# Patient Record
Sex: Male | Born: 1995 | Race: Black or African American | Hispanic: No | Marital: Single | Smoking: Never smoker
Health system: Southern US, Community
[De-identification: ages and names within clinical notes are randomized; demographics above are authoritative.]

## PROBLEM LIST (undated history)

## (undated) DIAGNOSIS — T4145XA Adverse effect of unspecified anesthetic, initial encounter: Secondary | ICD-10-CM

## (undated) DIAGNOSIS — R519 Headache, unspecified: Secondary | ICD-10-CM

## (undated) DIAGNOSIS — S86019A Strain of unspecified Achilles tendon, initial encounter: Secondary | ICD-10-CM

## (undated) DIAGNOSIS — T8859XA Other complications of anesthesia, initial encounter: Secondary | ICD-10-CM

## (undated) DIAGNOSIS — R51 Headache: Secondary | ICD-10-CM

## (undated) DIAGNOSIS — J189 Pneumonia, unspecified organism: Secondary | ICD-10-CM

## (undated) DIAGNOSIS — T7840XA Allergy, unspecified, initial encounter: Secondary | ICD-10-CM

## (undated) HISTORY — PX: TONSILLECTOMY: SUR1361

## (undated) HISTORY — PX: APPENDECTOMY: SHX54

## (undated) HISTORY — DX: Allergy, unspecified, initial encounter: T78.40XA

## (undated) HISTORY — PX: HERNIA REPAIR: SHX51

---
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
## 2014-01-24 ENCOUNTER — Ambulatory Visit (INDEPENDENT_AMBULATORY_CARE_PROVIDER_SITE_OTHER): Payer: PRIVATE HEALTH INSURANCE | Admitting: Physician Assistant

## 2014-01-24 ENCOUNTER — Encounter: Payer: Self-pay | Admitting: Physician Assistant

## 2014-01-24 VITALS — BP 142/73 | HR 69 | Temp 98.4°F | Resp 16 | Ht 72.0 in | Wt 247.0 lb

## 2014-01-24 DIAGNOSIS — Z13220 Encounter for screening for lipoid disorders: Secondary | ICD-10-CM

## 2014-01-24 DIAGNOSIS — Z0000 Encounter for general adult medical examination without abnormal findings: Secondary | ICD-10-CM

## 2014-01-24 DIAGNOSIS — R51 Headache: Secondary | ICD-10-CM

## 2014-01-24 DIAGNOSIS — Z113 Encounter for screening for infections with a predominantly sexual mode of transmission: Secondary | ICD-10-CM

## 2014-01-24 DIAGNOSIS — R519 Headache, unspecified: Secondary | ICD-10-CM

## 2014-01-24 NOTE — Progress Notes (Signed)
   Subjective:    Patient ID: Larry Haas, male    DOB: 16-Mar-1995, 19 y.o.   MRN: [id number]  HPI    Review of Systems  Constitutional: Positive for diaphoresis.  Eyes: Negative.   Respiratory: Positive for shortness of breath.   Cardiovascular: Negative.   Gastrointestinal: Negative.   Endocrine: Positive for heat intolerance and polyphagia.  Genitourinary: Negative.   Musculoskeletal: Negative.   Skin: Negative.   Allergic/Immunologic: Negative.   Neurological: Positive for headaches.  Hematological: Negative.   Psychiatric/Behavioral: Negative.        Objective:   Physical Exam        Assessment & Plan:

## 2014-01-24 NOTE — Patient Instructions (Signed)
Your physical exam was normal today. I will be in contact with you regarding the results of your labs. I put in an order for you to get your cholesterol drawn anytime in the next year. Just be sure you are fasting and come to the clinic when we are open. I will let you know those results when they're available.  A good first place to start as far as diet is limiting all sugary drinks. Another good idea is limiting the alcohol. Try to do one or both of these over the next few months. We can either see you back in 3 months to discuss further, see you back in 1 year for another physical, or if you're still struggling despite these measures I can refer you to a dietitiian. Please just let me know.

## 2014-01-24 NOTE — Progress Notes (Signed)
Subjective:    Patient ID: Larry Haas, male    DOB: 07-16-1995, 19 y.o.   MRN: [id number]  PCP: No PCP Per Patient  Chief Complaint  Patient presents with  . Annual Exam   There are no active problems to display for this patient.  Prior to Admission medications   Medication Sig Start Date End Date Taking? Authorizing Provider  Omega-3 Fatty Acids (FISH OIL PO) Take 1 capsule by mouth at bedtime.   Yes Historical Provider, MD   Medications, allergies, past medical history, surgical history, family history, social history and problem list reviewed and updated.  HPI  19 yom with no significant pmh presents for annual physical exam today.  No issues ongoing, he states his parents requested he come have a physical.  Student at [employer]. He exercises most days both weights and cardio. Eats well balanced meals whether at home or school. He is interested in losing a few pounds and is interested in some food tips. He drinks occasional sugary drinks. Drinks alcohol on weekends socially, also eats a lot of fast food/pizza when he is drinking.   Avg meals: Breakfast: Eggs, bacon, grits Lunch: Sandwich/fruit Dinner: Veggies/meat/pasta Snacks: Fruit/granola  He has been having HAs on Tues and Thurs afternoons for the past few months. He has a late night class on Mon and Wed then has stressful days on T/R. He has assoc photo/phonophobia. No aura. The HAs are frontal and relieve with water and aleve. No vision changes. He has never been diagnosed with migraines.   Received flu vaccine 11/2103. TDap within past 5 years per pt.   Interested in std screening. No sx but multiple partners. Uses protection most times.   States his mom wants him to have cholesterol checked.  Review of Systems No CP, fever, chills. Positive sob with heavy exertion. Positive sweating with exertion.     Objective:   Physical Exam  Constitutional: He is oriented to person, place, and time. He appears  well-developed and well-nourished.  Non-toxic appearance. He does not have a sickly appearance. He does not appear ill. No distress.  BP 142/73 mmHg  Pulse 69  Temp(Src) 98.4 F (36.9 C) (Oral)  Resp 16  Ht 6' (1.829 m)  Wt 247 lb (112.038 kg)  BMI 33.49 kg/m2  SpO2 99%   HENT:  Right Ear: Tympanic membrane normal.  Left Ear: Tympanic membrane normal.  Mouth/Throat: Uvula is midline, oropharynx is clear and moist and mucous membranes are normal. No oropharyngeal exudate, posterior oropharyngeal edema, posterior oropharyngeal erythema or tonsillar abscesses.  Eyes: Conjunctivae and EOM are normal. Pupils are equal, round, and reactive to light.  Neck: Normal range of motion. No thyromegaly present.  Cardiovascular: Normal rate, regular rhythm and normal heart sounds.  Exam reveals no gallop.   No murmur heard. Pulses:      Dorsalis pedis pulses are 2+ on the right side, and 2+ on the left side.  Pulmonary/Chest: Effort normal and breath sounds normal. He has no decreased breath sounds. He has no wheezes. He has no rhonchi. He has no rales.  Abdominal: Soft. Normal appearance and bowel sounds are normal. There is no tenderness. Hernia confirmed negative in the right inguinal area and confirmed negative in the left inguinal area.  Genitourinary: Testes normal and penis normal. Right testis shows no mass and no tenderness. Left testis shows no mass and no tenderness. Circumcised.  Neurological: He is alert and oriented to person, place, and time. He has normal strength.  No cranial nerve deficit or sensory deficit.  Reflex Scores:      Patellar reflexes are 2+ on the right side and 2+ on the left side. Psychiatric: He has a normal mood and affect. His speech is normal.      Assessment & Plan:   19 yom with no significant pmh presents for annual physical exam today.  Annual physical exam --exam benign --bp mildly elevated, encouraged aerobic exercise, limiting salt, repeat at next  visit --rtc one year --rec limiting sugary drinks, alcohol, and late night snacks, if weight elevation persists can refer to dietitian, pt will let me know if interested --rec abstaining from alcohol   Nonintractable episodic headache, unspecified headache type --most likely due to stress, dehydration, lack of sleep with its predictable pattern --rec sleep, fluids, continue aleve/can try excedrin migraine --rtc if worsening  Screen for STD (sexually transmitted disease) - Plan: HIV antibody, RPR, GC/Chlamydia Probe Amp --encouraged protection and limiting number partners --hiv,rpr, gc/ct testing today --has received gardasil vaccine  Screening for hyperlipidemia - Plan: Lipid panel --pt not fasting, put in future order for lipids  Donnajean Lopes, PA-C Physician Assistant-Certified [facility]  01/24/2014 1:07 PM

## 2014-01-25 LAB — HIV ANTIBODY (ROUTINE TESTING W REFLEX): HIV: NONREACTIVE

## 2014-01-25 LAB — RPR

## 2014-01-26 LAB — GC/CHLAMYDIA PROBE AMP
CT Probe RNA: NEGATIVE
GC PROBE AMP APTIMA: NEGATIVE

## 2014-10-18 ENCOUNTER — Encounter (HOSPITAL_COMMUNITY): Payer: Self-pay | Admitting: Emergency Medicine

## 2014-10-18 ENCOUNTER — Emergency Department (HOSPITAL_COMMUNITY)
Admission: EM | Admit: 2014-10-18 | Discharge: 2014-10-18 | Disposition: A | Discharge status: Home / Self Care | Payer: 59 | Source: Home / Self Care | Attending: Emergency Medicine | Admitting: Emergency Medicine

## 2014-10-18 DIAGNOSIS — B349 Viral infection, unspecified: Secondary | ICD-10-CM

## 2014-10-18 DIAGNOSIS — K226 Gastro-esophageal laceration-hemorrhage syndrome: Secondary | ICD-10-CM

## 2014-10-18 LAB — POCT I-STAT, CHEM 8
BUN: 25 mg/dL — ABNORMAL HIGH (ref 6–20)
CALCIUM ION: 1.22 mmol/L (ref 1.12–1.23)
Chloride: 104 mmol/L (ref 101–111)
Creatinine, Ser: 1.1 mg/dL (ref 0.61–1.24)
Glucose, Bld: 91 mg/dL (ref 65–99)
HCT: 55 % — ABNORMAL HIGH (ref 39.0–52.0)
Hemoglobin: 18.7 g/dL — ABNORMAL HIGH (ref 13.0–17.0)
Potassium: 3.5 mmol/L (ref 3.5–5.1)
SODIUM: 142 mmol/L (ref 135–145)
TCO2: 26 mmol/L (ref 0–100)

## 2014-10-18 MED ORDER — ONDANSETRON 4 MG PO TBDP: 4.0000 mg | ORAL_TABLET | Freq: Three times a day (TID) | ORAL | Status: DC | PRN

## 2014-10-18 MED ORDER — ONDANSETRON 4 MG PO TBDP
4.0000 mg | ORAL_TABLET | Freq: Once | ORAL | Status: AC
  Administered 2014-10-18: 4 mg via ORAL

## 2014-10-18 MED ORDER — ONDANSETRON 4 MG PO TBDP
ORAL_TABLET | ORAL | Status: AC
  Filled 2014-10-18: qty 1

## 2014-10-18 NOTE — ED Provider Notes (Signed)
CSN: [id number]     Arrival date & time 10/18/14  1310 History   First MD Initiated Contact with Patient 10/18/14 1341     Chief Complaint  Patient presents with  . Hematemesis  . Cough  . Dizziness  . Headache  . Sore Throat   (Consider location/radiation/quality/duration/timing/severity/associated sxs/prior Treatment) HPI  He is a 19 year old man here with his mother for evaluation of vomiting blood. He states he has had mild congestion and a cough for the last week. This was associated with some intermittent shortness of breath. Then, this morning, he started vomiting. He had 6 fairly violent episodes of vomiting, the last 3 he states were straight blood. He reports some continued mild nausea. Last vomiting was 11:30 this morning. He denies any chest pain or abdominal pain. No diarrhea. He also reports getting a headache this morning. He states he does have a history of migraines, but this is different. He states it moved around his head from one side to the other. He denies any pain at this moment. He also reports feeling dizzy and sweaty when he was standing in line at the check-in.  No fevers.  Past Medical History  Diagnosis Date  . Allergy    Past Surgical History  Procedure Laterality Date  . Tonsillectomy    . Appendectomy    . Hernia repair      At 9 months old    Family History  Problem Relation Age of Onset  . Hyperlipidemia Mother   . Hypertension Mother   . Diabetes Maternal Grandmother   . Hyperlipidemia Maternal Grandmother   . Hypertension Maternal Grandmother   . Cancer Maternal Grandfather   . Heart disease Maternal Grandfather   . Hyperlipidemia Maternal Grandfather   . Hypertension Maternal Grandfather   . Diabetes Paternal Grandmother   . Hyperlipidemia Paternal Grandfather   . Hypertension Paternal Grandfather   . Mental illness Paternal Grandfather    Social History  Substance Use Topics  . Smoking status: Never Smoker   . Smokeless tobacco: None   . Alcohol Use: No    Review of Systems As in history of present illness Allergies  Review of patient's allergies indicates no known allergies.  Home Medications   Prior to Admission medications   Medication Sig Start Date End Date Taking? Authorizing Provider  ibuprofen (ADVIL) 200 MG tablet Take 400 mg by mouth every 6 (six) hours as needed.   Yes Historical Provider, MD  Omega-3 Fatty Acids (FISH OIL PO) Take 1 capsule by mouth at bedtime.    Historical Provider, MD  ondansetron (ZOFRAN-ODT) 4 MG disintegrating tablet Take 1 tablet (4 mg total) by mouth every 8 (eight) hours as needed for nausea or vomiting. 10/18/14   Charm Rings, MD   Meds Ordered and Administered this Visit   Medications  ondansetron (ZOFRAN-ODT) disintegrating tablet 4 mg (4 mg Oral Given 10/18/14 1420)    BP 138/68 mmHg  Pulse 64  Temp(Src) 98.1 F (36.7 C) (Oral)  Resp 18  SpO2 100% Orthostatic VS for the past 24 hrs:  BP- Lying Pulse- Lying BP- Sitting Pulse- Sitting BP- Standing at 0 minutes Pulse- Standing at 0 minutes  10/18/14 1438 121/61 mmHg 58 117/61 mmHg 81 125/50 mmHg 83    Physical Exam  Constitutional: He is oriented to person, place, and time. He appears well-developed and well-nourished. No distress.  HENT:  Mouth/Throat: Oropharynx is clear and moist. No oropharyngeal exudate.  Eyes: Conjunctivae are normal.  Neck: Neck  supple.  Cardiovascular: Normal rate, regular rhythm and normal heart sounds.   No murmur heard. Pulmonary/Chest: Effort normal and breath sounds normal. No respiratory distress. He has no wheezes. He has no rales.  Abdominal: Soft. Bowel sounds are normal. He exhibits no distension. There is tenderness (denies pain, but states it felt off with palpation in the epigastric area.). There is no rebound and no guarding.  Lymphadenopathy:    He has no cervical adenopathy.  Neurological: He is alert and oriented to person, place, and time.    ED Course  Procedures  (including critical care time)  Labs Review Labs Reviewed  POCT I-STAT, CHEM 8 - Abnormal; Notable for the following:    BUN 25 (*)    Hemoglobin 18.7 (*)    HCT 55.0 (*)    All other components within normal limits    Imaging Review No results found.   MDM   1. Mallory-Weiss tear   2. Viral illness    Orthostatics and i-STAT are within normal limits. He reports feeling better after the Zofran. He likely has a viral illness that caused the vomiting and has a small Mallory-Weiss tear. Recommended symptomatic treatment with Zofran as needed. Return precautions reviewed.    Charm Rings, MD 10/18/14 1447

## 2014-10-18 NOTE — ED Notes (Signed)
Pt started with a cough one week ago, developed a sore throat yesterday, then woke up this morning with a headache at 0400.  About mid morning he vomited 6 times and states the last three times he did he threw up only blood.  Pt has a picture of it on his phone.  He denies any fever.

## 2014-10-18 NOTE — Discharge Instructions (Signed)
You likely have a virus that is causing your symptoms. The blood in your vomit is from a small tear in the esophagus. Your vital signs and hemoglobin level are excellent. If we can control your nausea and vomiting, this tear will heal over the next few days. Take Zofran every 8 hours as needed for nausea or vomiting. No weight lifting until Saturday. If you continue to have blood in your vomit, you are getting dizzy, see blood in your stool, please go to the emergency room.

## 2014-11-16 ENCOUNTER — Ambulatory Visit (INDEPENDENT_AMBULATORY_CARE_PROVIDER_SITE_OTHER): Payer: 59 | Admitting: Physician Assistant

## 2014-11-16 VITALS — BP 120/74 | HR 63 | Temp 97.9°F | Resp 16 | Ht 71.25 in | Wt 247.4 lb

## 2014-11-16 DIAGNOSIS — J069 Acute upper respiratory infection, unspecified: Secondary | ICD-10-CM | POA: Diagnosis not present

## 2014-11-16 MED ORDER — HYDROCOD POLST-CPM POLST ER 10-8 MG/5ML PO SUER: 5.0000 mL | Freq: Every evening | ORAL | Status: DC | PRN

## 2014-11-16 MED ORDER — IPRATROPIUM BROMIDE 0.03 % NA SOLN: 2.0000 | Freq: Two times a day (BID) | NASAL | Status: DC

## 2014-11-16 MED ORDER — BENZONATATE 100 MG PO CAPS: 100.0000 mg | ORAL_CAPSULE | Freq: Three times a day (TID) | ORAL | Status: DC | PRN

## 2014-11-16 NOTE — Patient Instructions (Signed)
Upper Respiratory Infection, Adult Most upper respiratory infections (URIs) are a viral infection of the air passages leading to the lungs. A URI affects the nose, throat, and upper air passages. The most common type of URI is nasopharyngitis and is typically referred to as "the common cold." URIs run their course and usually go away on their own. Most of the time, a URI does not require medical attention, but sometimes a bacterial infection in the upper airways can follow a viral infection. This is called a secondary infection. Sinus and middle ear infections are common types of secondary upper respiratory infections. Bacterial pneumonia can also complicate a URI. A URI can worsen asthma and chronic obstructive pulmonary disease (COPD). Sometimes, these complications can require emergency medical care and may be life threatening.  CAUSES Almost all URIs are caused by viruses. A virus is a type of germ and can spread from one person to another.  RISKS FACTORS You may be at risk for a URI if:   You smoke.   You have chronic heart or lung disease.  You have a weakened defense (immune) system.   You are very young or very old.   You have nasal allergies or asthma.  You work in crowded or poorly ventilated areas.  You work in health care facilities or schools. SIGNS AND SYMPTOMS  Symptoms typically develop 2-3 days after you come in contact with a cold virus. Most viral URIs last 7-10 days. However, viral URIs from the influenza virus (flu virus) can last 14-18 days and are typically more severe. Symptoms may include:   Runny or stuffy (congested) nose.   Sneezing.   Cough.   Sore throat.   Headache.   Fatigue.   Fever.   Loss of appetite.   Pain in your forehead, behind your eyes, and over your cheekbones (sinus pain).  Muscle aches.  DIAGNOSIS  Your health care provider may diagnose a URI by:  Physical exam.  Tests to check that your symptoms are not due to  another condition such as:  Strep throat.  Sinusitis.  Pneumonia.  Asthma. TREATMENT  A URI goes away on its own with time. It cannot be cured with medicines, but medicines may be prescribed or recommended to relieve symptoms. Medicines may help:  Reduce your fever.  Reduce your cough.  Relieve nasal congestion. HOME CARE INSTRUCTIONS   Take medicines only as directed by your health care provider.   Gargle warm saltwater or take cough drops to comfort your throat as directed by your health care provider.  Use a warm mist humidifier or inhale steam from a shower to increase air moisture. This may make it easier to breathe.  Drink enough fluid to keep your urine clear or pale yellow.   Eat soups and other clear broths and maintain good nutrition.   Rest as needed.   Return to work when your temperature has returned to normal or as your health care provider advises. You may need to stay home longer to avoid infecting others. You can also use a face mask and careful hand washing to prevent spread of the virus.  Increase the usage of your inhaler if you have asthma.   Do not use any tobacco products, including cigarettes, chewing tobacco, or electronic cigarettes. If you need help quitting, ask your health care provider. PREVENTION  The best way to protect yourself from getting a cold is to practice good hygiene.   Avoid oral or hand contact with people with cold   symptoms.   Wash your hands often if contact occurs.  There is no clear evidence that vitamin C, vitamin E, echinacea, or exercise reduces the chance of developing a cold. However, it is always recommended to get plenty of rest, exercise, and practice good nutrition.  SEEK MEDICAL CARE IF:   You are getting worse rather than better.   Your symptoms are not controlled by medicine.   You have chills.  You have worsening shortness of breath.  You have brown or red mucus.  You have yellow or brown nasal  discharge.  You have pain in your face, especially when you bend forward.  You have a fever.  You have swollen neck glands.  You have pain while swallowing.  You have white areas in the back of your throat. SEEK IMMEDIATE MEDICAL CARE IF:   You have severe or persistent:  Headache.  Ear pain.  Sinus pain.  Chest pain.  You have chronic lung disease and any of the following:  Wheezing.  Prolonged cough.  Coughing up blood.  A change in your usual mucus.  You have a stiff neck.  You have changes in your:  Vision.  Hearing.  Thinking.  Mood. MAKE SURE YOU:   Understand these instructions.  Will watch your condition.  Will get help right away if you are not doing well or get worse.   This information is not intended to replace advice given to you by your health care provider. Make sure you discuss any questions you have with your health care provider.   Document Released: 07/03/2000 Document Revised: 05/24/2014 Document Reviewed: 04/14/2013 Elsevier Interactive Patient Education 2016 Elsevier Inc.  

## 2014-11-16 NOTE — Progress Notes (Signed)
   Subjective:    Patient ID: Larry Haas, male    DOB: 09/14/1995, 19 y.o.   MRN: [id number]  HPI Patient presents for cough that has been present for the past 6 days and is now so bad the coughing causes headaches, SOB, and has had an episode of vomiting and bright blood came out in vomit. Additionally endorses congestion, rhinorrhea, and a day of chills. Denies fever, sinus pressure, wheezing, palpitations, edema, or CP. Was sick with similar symptoms 3 weeks ago and felt better, but then a few days later cough started flaring up again. Has multiple sick contacts living in dorms and adds has more people around him that are sick than are well. Has seasonal allergies that are currently controlled, but no h/o asthma. NKDA.   Review of Systems As noted above.    Objective:   Physical Exam  Constitutional: He is oriented to person, place, and time. He appears well-developed and well-nourished. No distress.  Blood pressure 120/74, pulse 63, temperature 97.9 F (36.6 C), temperature source Oral, resp. rate 16, height 5' 11.25" (1.81 m), weight 247 lb 6.4 oz (112.22 kg), SpO2 98 %.  HENT:  Head: Normocephalic and atraumatic.  Right Ear: Tympanic membrane, external ear and ear canal normal.  Left Ear: Tympanic membrane and ear canal normal.  Nose: Rhinorrhea present. No mucosal edema. Right sinus exhibits no maxillary sinus tenderness and no frontal sinus tenderness. Left sinus exhibits no maxillary sinus tenderness and no frontal sinus tenderness.  Mouth/Throat: Uvula is midline, oropharynx is clear and moist and mucous membranes are normal. No oropharyngeal exudate, posterior oropharyngeal edema or posterior oropharyngeal erythema.  Bilateral hypertrophic tonsils.   Eyes: Conjunctivae are normal. Pupils are equal, round, and reactive to light. Right eye exhibits no discharge. Left eye exhibits no discharge. No scleral icterus.  Neck: Normal range of motion. Neck supple. No thyromegaly  present.  Cardiovascular: Normal rate, regular rhythm and normal heart sounds.  Exam reveals no gallop and no friction rub.   No murmur heard. Pulmonary/Chest: Effort normal and breath sounds normal. No respiratory distress. He has no wheezes. He has no rales. He exhibits no tenderness.  Abdominal: Soft. Bowel sounds are normal. He exhibits no distension and no mass. There is no tenderness. There is no rebound and no guarding.  Lymphadenopathy:    He has no cervical adenopathy.  Neurological: He is alert and oriented to person, place, and time.  Skin: Skin is warm and dry. No rash noted. He is not diaphoretic. No erythema. No pallor.       Assessment & Plan:  1. Acute upper respiratory infection Plenty of fluid and rest. - benzonatate (TESSALON) 100 MG capsule; Take 1-2 capsules (100-200 mg total) by mouth 3 (three) times daily as needed for cough.  Dispense: 40 capsule; Refill: 0 - ipratropium (ATROVENT) 0.03 % nasal spray; Place 2 sprays into both nostrils 2 (two) times daily.  Dispense: 30 mL; Refill: 0 - chlorpheniramine-HYDROcodone (TUSSIONEX PENNKINETIC ER) 10-8 MG/5ML SUER; Take 5 mLs by mouth at bedtime as needed for cough.  Dispense: 75 mL; Refill: 0   Jarmaine Ehrler PA-C  [facility] 11/16/2014 4:57 PM

## 2014-12-12 NOTE — Progress Notes (Signed)
  Medical screening examination/treatment/procedure(s) were performed by non-physician practitioner and as supervising physician I was immediately available for consultation/collaboration.     

## 2015-01-03 ENCOUNTER — Encounter: Payer: Self-pay | Admitting: Physician Assistant

## 2015-01-03 ENCOUNTER — Ambulatory Visit (INDEPENDENT_AMBULATORY_CARE_PROVIDER_SITE_OTHER): Payer: 59

## 2015-01-03 ENCOUNTER — Ambulatory Visit (INDEPENDENT_AMBULATORY_CARE_PROVIDER_SITE_OTHER): Payer: 59 | Admitting: Physician Assistant

## 2015-01-03 VITALS — BP 136/83 | HR 70 | Temp 97.9°F | Resp 16 | Ht 72.5 in | Wt 253.0 lb

## 2015-01-03 DIAGNOSIS — R059 Cough, unspecified: Secondary | ICD-10-CM

## 2015-01-03 DIAGNOSIS — L659 Nonscarring hair loss, unspecified: Secondary | ICD-10-CM | POA: Diagnosis not present

## 2015-01-03 DIAGNOSIS — Z111 Encounter for screening for respiratory tuberculosis: Secondary | ICD-10-CM | POA: Diagnosis not present

## 2015-01-03 DIAGNOSIS — Z23 Encounter for immunization: Secondary | ICD-10-CM | POA: Diagnosis not present

## 2015-01-03 DIAGNOSIS — R05 Cough: Secondary | ICD-10-CM

## 2015-01-03 NOTE — Progress Notes (Signed)
[facility] [address], [place] [id number] [phone]  Date:  01/03/2015   Name:  Larry HenriKullen Samuel Basham   DOB:  01/29/1995   MRN:  [id number]  PCP:  No PCP Per Patient    Chief Complaint: Body Fluid Exposure and Cough   History of Present Illness:  This is a 19 y.o. male who is presenting stating he was possibly exposed to someone with TB. He states he has been living with his current roommate x 3 months. He learned yesterday morning that his roommate was treated for latent TB 2 years ago. He is worried that he has contracted it. He states his roommate coughs all the time. In addition, he reports he has been coughing for the past 2 months. He was seen here 10/26 and dx'd with viral uri. Treated supportively and symptoms improved. He now has cough that is a mix of dry and productive. Happens mostly in the morning when he wakes up. He does have a hx seasonal allergies but only in spring time. No hx asthma and not a smoker. He has had some intermittent nasal congestion over past 2 months as well. No itchy eyes or itchy throat. Denies fever, chills, night sweats or unintended weight loss. He did have an episode of hemoptysis 9/27 during another uri and was dx'd with mallory weiss tears. No hemoptysis since. Pt is a Consulting civil engineerstudent. 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 Has never been tested for TB. Born [place] the [place]  Pt also wondering about a bald spot on the back of his head that has been present x 5 months. Asymptomatic. Mother has alopecia.  Review of Systems:  Review of Systems See HPI  There are no active problems to display for this patient.   Prior to Admission medications   Medication Sig Start Date End Date Taking? Authorizing Provider  ibuprofen (ADVIL) 200 MG tablet Take 400 mg by mouth every 6 (six) hours as needed.   Yes Historical Provider, MD                  No Known Allergies  Past Surgical History  Procedure Laterality Date  . Tonsillectomy    . Appendectomy    . Hernia  repair      At 56 months old     Social History  Substance Use Topics  . Smoking status: Never Smoker   . Smokeless tobacco: None  . Alcohol Use: No    Family History  Problem Relation Age of Onset  . Hyperlipidemia Mother   . Hypertension Mother   . Diabetes Maternal Grandmother   . Hyperlipidemia Maternal Grandmother   . Hypertension Maternal Grandmother   . Cancer Maternal Grandfather   . Heart disease Maternal Grandfather   . Hyperlipidemia Maternal Grandfather   . Hypertension Maternal Grandfather   . Diabetes Paternal Grandmother   . Hyperlipidemia Paternal Grandfather   . Hypertension Paternal Grandfather   . Mental illness Paternal Grandfather     Medication list has been reviewed and updated.  Physical Examination:  Physical Exam  Constitutional: He is oriented to person, place, and time. He appears well-developed and well-nourished. No distress.  HENT:  Head: Normocephalic and atraumatic.  Right Ear: Hearing, tympanic membrane, external ear and ear canal normal.  Left Ear: Hearing, tympanic membrane, external ear and ear canal normal.  Nose: Mucosal edema present.  Mouth/Throat: Uvula is midline, oropharynx is clear and moist and mucous membranes are normal.  Eyes: Conjunctivae and lids are normal.  Right eye exhibits no discharge. Left eye exhibits no discharge. No scleral icterus.  Cardiovascular: Normal rate, regular rhythm, normal heart sounds and normal pulses.   No murmur heard. Pulmonary/Chest: Effort normal and breath sounds normal. No respiratory distress. He has no wheezes. He has no rhonchi. He has no rales.  Musculoskeletal: Normal range of motion.  Lymphadenopathy:       Head (right side): No submental, no submandibular and no tonsillar adenopathy present.       Head (left side): No submental, no submandibular and no tonsillar adenopathy present.    He has no cervical adenopathy.  Neurological: He is alert and oriented to person, place, and time.   Skin: Skin is warm, dry and intact.  3 cm balding area on back on scalp. Fine hairs scattered throughout  Psychiatric: He has a normal mood and affect. His speech is normal and behavior is normal. Thought content normal.    BP 136/83 mmHg  Pulse 70  Temp(Src) 97.9 F (36.6 C)  Resp 16  Ht 6' 0.5" (1.842 m)  Wt 253 lb (114.76 kg)  BMI 33.82 kg/m2  [facility] reading (PRIMARY) by  PA Armonii Sieh: initial read, please comment.  IMPRESSION: No active cardiopulmonary disease.  Assessment and Plan:   1. Cough 2. Screening-pulm TB Radiograph negative. PPD placed. He will return to have it read. Let pt know he should not be at risk being around someone who was treated for TB 2 years ago. Pt does have a history of 2 months cough and episode of hemoptysis 3 months ago (dx'd mallory weiss tear). Suspect cough d/t allergies. He has a hx allergies. Has associated nasal congestion as well. He he will flonase and zyrtec and take daily for at least next 2 weeks. He will let me know in 2 weeks if cough is not clearing up. - DG Chest 2 View; Future - TB Skin Test  3. Needs flu shot - Flu Vaccine QUAD 36+ mos IM  4. Alopecia  Bald patch on back on scalp x 5 months. Mother with hx alopecia areata. Offered referral to derm. He will call if he decides he wants.  Roswell Miners Dyke Brackett, MHS [facility]  01/03/2015

## 2015-01-03 NOTE — Progress Notes (Signed)
Tuberculosis Risk Questionnaire  1. Were you born outside the [place] in one of the following parts of the world:    [place], [place], [place], [place] or [place]?  No  2. Have you traveled outside the [place] and lived for more than one month in one of the following parts of the world:  [place], [place], [place], [place] or [place]?  No  3. Do you have a compromised immune system such as from any of the following conditions:  HIV/AIDS, organ or bone marrow transplantation, diabetes, immunosuppressive   medicines (e.g. Prednisone, Remicaide), leukemia, lymphoma, cancer of the   head or neck, gastrectomy or jejunal bypass, end-stage renal disease (on   dialysis), or silicosis?  No    4. Have you ever done one of the following:    Used crack cocaine, injected illegal drugs, worked or resided in jail or prison,   worked or resided at a homeless shelter, or worked as a Research scientist (physical sciences)healthcare worker in   direct contact with patients?  No  5. Have you ever been exposed to anyone with infectious tuberculosis?  Yes Tuberculosis Symptom Questionnaire  Do you currently have any of the following symptoms?  1. Unexplained cough lasting more than 3 weeks? Yes   Unexplained fever lasting more than 3 weeks. No   3. Night Sweats (sweating that leaves the bedclothes and sheets wet)   Yes   4. Shortness of Breath Yes   5. Chest Pain No  6. Unintentional weight loss  No  7. Unexplained fatigue (very tired for no reason) yes

## 2015-01-03 NOTE — Patient Instructions (Addendum)
Buy flonase and/or zyrtec over the counter and take daily for allergies. This should help your cough as well. Return in 48-72 hours to have your tb test read. Let me know if you would like a referral to a dermatologist for alopecia.' If your cough is still not getting better in 2 weeks, let me know.  Alopecia Areata Alopecia areata is a type of hair loss. If you have this condition, you may lose hair on your scalp in patches. In some cases, you may lose all the hair on your scalp (alopecia totalis) or all the hair from your face and body (alopecia universalis).  Alopecia areata is an autoimmune disease. This means your body's defense system (immune system) mistakes normal parts of your body for germs or other things that can make you sick. When you have alopecia areata, your immune system attacks your hair follicles.  Alopecia areata often starts during childhood but can occur at any age. Alopecia areata is not a danger to your health but can be stressful.  CAUSES  The cause of alopecia areata is unknown.  RISK FACTORS You may be at higher risk of alopecia areata if you:   Have a family history of alopecia.  Have a family history of another autoimmune disease, including type 1 diabetes and rheumatoid arthritis. SIGNS AND SYMPTOMS Signs of alopecia areata may include:  Loss of scalp hair in small, round patches. These may be about the size of a quarter.  Loss of all hair on your scalp.  Loss of eyebrow hair, facial hair, or the hair inside your nose (nasal hair).  Hair loss over your entire body. DIAGNOSIS  Alopecia areata may be diagnosed by:  Medical history and physical exam.  Taking a sample of hair to check under a microscope.  Taking a small piece of skin (biopsy) to examine under a microscope.  Blood tests to rule out other autoimmune diseases. TREATMENT  There is no cure for alopecia areata, but the disease often goes away over time. You will not lose the ability to  regrow hair. Some medicines may help your hair regrow more quickly. These include:  Corticosteroids. These block inflammation caused by your immune system. You may get this medicine as a lotion for your skin or as an injection.  Minoxidil. This is a hair growth medicine you can use in areas of hair loss.  Anthralin. This is a medicine for a skin inflammation called psoriasis that may also help alopecia.  Diphencyprone. This medicine is applied to your skin and may stimulate hair growth. HOME CARE INSTRUCTIONS  Use sunscreen or cover your head when outdoors.  Take medicines only as directed by your health care provider.  If you have lost your eyebrows, wear sunglasses outside to keep dust out of your eyes.  If you have lost hair inside your nose, wear a kerchief over your face or apply ointment to the inside of your nose. This keeps out dust and other irritants.  Keep all follow-up visits as directed by your health care provider. This is important. SEEK MEDICAL CARE IF:  Your symptoms change.  You have new symptoms.  You have a reaction to your medicines.  You are struggling emotionally.   This information is not intended to replace advice given to you by your health care provider. Make sure you discuss any questions you have with your health care provider.   Document Released: 08/12/2003 Document Revised: 01/28/2014 Document Reviewed: 03/29/2013 Elsevier Interactive Patient Education [employer]2016 Elsevier Inc.

## 2015-01-06 ENCOUNTER — Encounter: Payer: Self-pay | Admitting: *Deleted

## 2015-01-06 DIAGNOSIS — Z111 Encounter for screening for respiratory tuberculosis: Secondary | ICD-10-CM

## 2015-01-06 LAB — TB SKIN TEST
Induration: 0 mm
TB Skin Test: NEGATIVE

## 2015-04-07 ENCOUNTER — Ambulatory Visit (INDEPENDENT_AMBULATORY_CARE_PROVIDER_SITE_OTHER): Payer: 59 | Admitting: Physician Assistant

## 2015-04-07 ENCOUNTER — Ambulatory Visit (INDEPENDENT_AMBULATORY_CARE_PROVIDER_SITE_OTHER): Payer: 59

## 2015-04-07 VITALS — BP 120/80 | HR 69 | Temp 98.1°F | Resp 18 | Wt 256.2 lb

## 2015-04-07 DIAGNOSIS — M79641 Pain in right hand: Secondary | ICD-10-CM | POA: Diagnosis not present

## 2015-04-07 DIAGNOSIS — S6991XA Unspecified injury of right wrist, hand and finger(s), initial encounter: Secondary | ICD-10-CM

## 2015-04-07 NOTE — Patient Instructions (Signed)
     IF you received an x-ray today, you will receive an invoice from [facility]. Please contact [facility] at [phone] with questions or concerns regarding your invoice.   IF you received labwork today, you will receive an invoice from [facility]. Please contact [facility] at [phone] with questions or concerns regarding your invoice.   Our billing staff will not be able to assist you with questions regarding bills from these companies.  You will be contacted with the lab results as soon as they are available. The fastest way to get your results is to activate your My Chart account. Instructions are located on the last page of this paperwork. If you have not heard from us regarding the results in 2 weeks, please contact this office.      

## 2015-04-07 NOTE — Progress Notes (Signed)
04/07/2015 4:04 PM   DOB: 12/09/1995 / MRN: [id number]  SUBJECTIVE:  Larry Haas is a 20 y.o. male presenting for hand pain that started two weeks ago after he punched a wall with the right hand. Reports some swelling and bruising after the injury which has resolved.  Complains of pain with MCP flexion today, however reports "I can do anything it just sometimes hurts if I move it just right."    He has No Known Allergies.   He  has a past medical history of Allergy.    He  reports that he has never smoked. He does not have any smokeless tobacco history on file. He reports that he does not drink alcohol or use illicit drugs. He  reports that he currently engages in sexual activity. He reports using the following method of birth control/protection: Condom. The patient  has past surgical history that includes Tonsillectomy; Appendectomy; and Hernia repair.  His family history includes Cancer in his maternal grandfather; Diabetes in his maternal grandmother and paternal grandmother; Heart disease in his maternal grandfather; Hyperlipidemia in his maternal grandfather, maternal grandmother, mother, and paternal grandfather; Hypertension in his maternal grandfather, maternal grandmother, mother, and paternal grandfather; Mental illness in his paternal grandfather.  Review of Systems  Constitutional: Negative for fever.  Respiratory: Negative for cough.   Musculoskeletal: Positive for joint pain.  Skin: Negative for rash.  Neurological: Negative for dizziness and headaches.  Psychiatric/Behavioral: Negative for depression.    Problem list and medications reviewed and updated by myself where necessary, and exist elsewhere in the encounter.   OBJECTIVE:  BP 120/80 mmHg  Pulse 69  Temp(Src) 98.1 F (36.7 C) (Oral)  Resp 18  Wt 256 lb 3.2 oz (116.212 kg)  SpO2 98%  Physical Exam  Constitutional: He is oriented to person, place, and time. He appears well-developed. He does not appear  ill.  Eyes: Conjunctivae and EOM are normal. Pupils are equal, round, and reactive to light.  Cardiovascular: Normal rate.   Pulmonary/Chest: Effort normal.  Abdominal: He exhibits no distension.  Musculoskeletal: Normal range of motion.       Arms: Neurological: He is alert and oriented to person, place, and time. No cranial nerve deficit. Coordination normal.  Skin: Skin is warm and dry. He is not diaphoretic.  Psychiatric: He has a normal mood and affect.  Nursing note and vitals reviewed.   No results found for this or any previous visit (from the past 72 hour(s)).  Dg Hand Complete Right  04/07/2015  CLINICAL DATA:  Right hand pain since punching a wall 3-4 weeks ago. Initial encounter. EXAM: RIGHT HAND - COMPLETE 3+ VIEW COMPARISON:  None. FINDINGS: There is no evidence of fracture or dislocation. There is no evidence of arthropathy or other focal bone abnormality. Soft tissues are unremarkable. IMPRESSION: Negative exam. Electronically Signed   By: Drusilla Kannerhomas  Dalessio M.D.   On: 04/07/2015 15:48    ASSESSMENT AND PLAN  Etter SjogrenKullen was seen today for hand injury.  Diagnoses and all orders for this visit:  Hand trauma, right, initial encounter: Rads negative.  Patient advised and will treat symptomatically with 600 ibuprofen q8 prn.    Right hand pain -     DG Hand Complete Right; Future    The patient was advised to call or return to clinic if he does not see an improvement in symptoms or to seek the care of the closest emergency department if he worsens with the above plan.   Casimiro NeedleMichael  Chestine Spore, MHS, PA-C [facility] 04/07/2015 4:04 PM

## 2015-06-06 ENCOUNTER — Emergency Department (HOSPITAL_COMMUNITY)
Admission: EM | Admit: 2015-06-06 | Discharge: 2015-06-06 | Disposition: A | Discharge status: Home / Self Care | Payer: 59 | Attending: Emergency Medicine | Admitting: Emergency Medicine

## 2015-06-06 ENCOUNTER — Emergency Department (HOSPITAL_COMMUNITY): Payer: 59

## 2015-06-06 ENCOUNTER — Encounter (HOSPITAL_COMMUNITY): Payer: Self-pay | Admitting: Emergency Medicine

## 2015-06-06 DIAGNOSIS — S86012A Strain of left Achilles tendon, initial encounter: Secondary | ICD-10-CM | POA: Diagnosis not present

## 2015-06-06 DIAGNOSIS — Y92310 Basketball court as the place of occurrence of the external cause: Secondary | ICD-10-CM | POA: Insufficient documentation

## 2015-06-06 DIAGNOSIS — S99912A Unspecified injury of left ankle, initial encounter: Secondary | ICD-10-CM | POA: Diagnosis present

## 2015-06-06 DIAGNOSIS — W51XXXA Accidental striking against or bumped into by another person, initial encounter: Secondary | ICD-10-CM | POA: Insufficient documentation

## 2015-06-06 DIAGNOSIS — Y9367 Activity, basketball: Secondary | ICD-10-CM | POA: Insufficient documentation

## 2015-06-06 DIAGNOSIS — Y998 Other external cause status: Secondary | ICD-10-CM | POA: Insufficient documentation

## 2015-06-06 DIAGNOSIS — Z79899 Other long term (current) drug therapy: Secondary | ICD-10-CM | POA: Diagnosis not present

## 2015-06-06 MED ORDER — OXYCODONE HCL 5 MG PO TABS
5.0000 mg | ORAL_TABLET | Freq: Once | ORAL | Status: AC
  Administered 2015-06-06: 5 mg via ORAL
  Filled 2015-06-06: qty 1

## 2015-06-06 MED ORDER — OXYCODONE HCL 5 MG PO TABS: 5.0000 mg | ORAL_TABLET | ORAL | Status: DC | PRN

## 2015-06-06 MED ORDER — IBUPROFEN 600 MG PO TABS: 600.0000 mg | ORAL_TABLET | Freq: Four times a day (QID) | ORAL | Status: DC | PRN

## 2015-06-06 NOTE — ED Notes (Signed)
Pt reports playing basketball and someone landed on his L ankle. Pt desires hearing or feeling a popping sensation. Pt is not able to bear wt by himself. Pt reports pain 5/10 and discomfort "12/10". Pt achilles appears swollen. Pedal pulses +2. Pt able to wiggle toes.Pt ankle appears swollen.

## 2015-06-06 NOTE — Discharge Instructions (Signed)
Take your medications as prescribed as needed for pain relief. I also recommend resting, elevating and applying ice to affected area for 15-20 minutes 3-4 times daily to help with pain and swelling. Continue wearing your leg splint, refrain from bearing weight and use crutches until he follow up with an orthopedist. Call the orthopedist office listed above to schedule a follow-up appointment this week. Return to the emergency department if symptoms worsen or new onset of fever, numbness, tingling, weakness.    Partial (Incomplete) Achilles Tendon Rupture An Achilles tendon rupture is an injury in which the tough, cord-like band that attaches the lower muscles of your leg to your heel (Achilles tendon) tears (ruptures). In a partial Achilles tendon rupture, surgery may not be needed. CAUSES  A tendon may rupture if it is weakened or weakening (degenerative) and a sudden stress is applied to it. Weakening or degeneration of a tendon may be caused by:  Recurrent injuries, such as those causing Achilles tendonitis.  Damaged tendons.  Aging.  Vascular disease of the tendon. SIGNS AND SYMPTOMS  Feeling as if you were struck violently in the back of the ankle.  Hearing a "pop" and experiencing severe, sudden (acute) pain; however, the absence of pain does not mean there was not a rupture. DIAGNOSIS A physical exam is usually all that is needed to diagnose an Achilles tendon rupture. During the exam, your health care provider will touch the tendon and the structures around it. You may be asked to lie on your stomach or kneel on a chair while your health care provider squeezes your calf muscle. You most likely have a ruptured tendon if your foot does not flex.  Sometimes tests are performed. These may include:  Ultrasonography. This allows quick confirmation of the diagnosis.  An X-ray exam.  An MRI. TREATMENT  Treatment consists of:  Ice applied to the area.  Pain relieving  medicines.  Rest.  Crutches.  Keeping the ankle from moving (immobilization), usually with asplint, for 6-10 weeks. If the injury does not improve within 3-6 months, you may need to go to a specialized runners' clinic or see a sports medicine specialist, physical therapist, or orthopedic surgeon. HOME CARE INSTRUCTIONS   Apply ice to the injured area:   Put ice in a plastic bag.  Place a towel between your skin and the bag.  Leave the ice on for 20 minutes, 2-3 times a day.  Use crutches and move about only as instructed by your health care provider.  Keep the leg elevated above the level of the heart (the center of the chest) at all times when not using the bathroom. Do not dangle the leg over a chair, couch, or bed. When lying down, elevate your leg on a few pillows. Elevation prevents swelling and reduces pain.  Avoid use other than gentle range of motion of the toes while the tendon is painful.  Do not drive a car until your health care provider specifically tells you it is safe to do so.  Take all medicines as directed by your health care provider.  Keep all follow-up visits with your health care provider. SEEK MEDICAL CARE IF:   Your pain and swelling increase or pain is uncontrolled with medicines.   You develop new, unexplained symptoms or your symptoms get worse.   You cannot move your toes or foot.  You develop warmth and swelling in your foot.  You have an unexplained fever.  MAKE SURE YOU:   Understand these instructions.  Will watch your condition.  Will get help right away if you are not doing well or get worse.   This information is not intended to replace advice given to you by your health care provider. Make sure you discuss any questions you have with your health care provider.   Document Released: 10/17/2004 Document Revised: 05/24/2014 Document Reviewed: 08/28/2012 Elsevier Interactive Patient Education [employer]2016 [employer] Inc.

## 2015-06-06 NOTE — Progress Notes (Signed)
Orthopedic Tech Progress Note Patient Details:  Ellen HenriKullen Samuel Demmer 08/27/1995 [id number] Applied fiberglass posterior short leg splint to LLE.  Pulses, sensation, motion intact before and after splinting.  Capillary refill less than 2 seconds before and after splinting.  Positioned Lt. foot in 30 degrees plantar flexion.  Fit pt. for crutches and taught use of same. Ortho Devices Type of Ortho Device: Post (short leg) splint, Crutches Ortho Device/Splint Location: LLE Ortho Device/Splint Interventions: Application   Lesle ChrisGilliland, Cornelious Diven L 06/06/2015, 10:45 PM

## 2015-06-06 NOTE — ED Provider Notes (Signed)
CSN: [id number]     Arrival date & time 06/06/15  2041 History  By signing my name below, I, Linna DarnerRussell Turner, attest that this documentation has been prepared under the direction and in the presence of non-physician practitioner, Melburn HakeNicole Nadeau, PA-C. Electronically Signed: Linna Darnerussell Turner, Scribe. 06/06/2015. 9:10 PM.   Chief Complaint  Patient presents with  . Ankle Injury    The history is provided by the patient. No language interpreter was used.     HPI Comments: Larry Haas is a 20 y.o. male who presents to the Emergency Department complaining of sudden onset, constant, severe, left ankle pain and swelling beginning approximately 1 hour ago. He reports that he was playing basketball and someone landed on his left ankle near his achilles. Pt notes that he did not hear any pops during the incident. Pt states that he is unable to bear weight on his left foot due to pain; he notes that when he does bear weight he experiences significant numbness throughout his left foot. He notes left foot soreness. He notes pain exacerbation with palpation to his lower left calf, left ankle, and left achilles as well as with raising of his left leg. He endorses shooting pain through his left ankle with palpation to his left heel. Pt has not taken any medications for pain PTA. He has no known allergies. Pt is able to wiggle his left toes. He denies sensation loss, left knee pain, or any other associated symptoms.  Past Medical History  Diagnosis Date  . Allergy    Past Surgical History  Procedure Laterality Date  . Tonsillectomy    . Appendectomy    . Hernia repair      At 386 months old    Family History  Problem Relation Age of Onset  . Hyperlipidemia Mother   . Hypertension Mother   . Diabetes Maternal Grandmother   . Hyperlipidemia Maternal Grandmother   . Hypertension Maternal Grandmother   . Cancer Maternal Grandfather   . Heart disease Maternal Grandfather   . Hyperlipidemia Maternal  Grandfather   . Hypertension Maternal Grandfather   . Diabetes Paternal Grandmother   . Hyperlipidemia Paternal Grandfather   . Hypertension Paternal Grandfather   . Mental illness Paternal Grandfather    Social History  Substance Use Topics  . Smoking status: Never Smoker   . Smokeless tobacco: None  . Alcohol Use: No    Review of Systems  Musculoskeletal: Positive for joint swelling and arthralgias.  Neurological: Positive for numbness.       Negative for sensation loss.   Allergies  Review of patient's allergies indicates no known allergies.  Home Medications   Prior to Admission medications   Medication Sig Start Date End Date Taking? Authorizing Provider  Multiple Vitamin (MULTIVITAMIN WITH MINERALS) TABS tablet Take 1 tablet by mouth daily.   Yes Historical Provider, MD  ibuprofen (ADVIL,MOTRIN) 600 MG tablet Take 1 tablet (600 mg total) by mouth every 6 (six) hours as needed. 06/06/15   Barrett HenleNicole Elizabeth Nadeau, PA-C  oxyCODONE (ROXICODONE) 5 MG immediate release tablet Take 1 tablet (5 mg total) by mouth every 4 (four) hours as needed for severe pain. 06/06/15   Satira SarkNicole Elizabeth Nadeau, PA-C   BP 143/92 mmHg  Pulse 82  Temp(Src) 98.2 F (36.8 C) (Oral)  Resp 18  Ht 6' (1.829 m)  Wt 112.038 kg  BMI 33.49 kg/m2  SpO2 100% Physical Exam  Constitutional: He is oriented to person, place, and time. He appears well-developed  and well-nourished.  HENT:  Head: Normocephalic and atraumatic.  Eyes: Conjunctivae and EOM are normal. Right eye exhibits no discharge. Left eye exhibits no discharge. No scleral icterus.  Pulmonary/Chest: Effort normal.  Musculoskeletal:       Left ankle: He exhibits decreased range of motion and swelling. He exhibits no ecchymosis, no deformity, no laceration and normal pulse. Tenderness (posterior ankle and distal achilles). No proximal fibula tenderness found. Achilles tendon exhibits pain, defect and abnormal Thompson's test results.  Exquisite  tenderness to palpation over proximal Achilles with mild swelling. Sensation grossly intact. FROM of left toes, forefoot, and knee. No tenderness over proximal tib/fib. Cap refill < 2.  Neurological: He is alert and oriented to person, place, and time.  Nursing note and vitals reviewed.   ED Course  Procedures (including critical care time)  DIAGNOSTIC STUDIES: Oxygen Saturation is 100% on RA, normal by my interpretation.    COORDINATION OF CARE: 9:10 PM Discussed treatment plan with pt at bedside and pt agreed to plan.  Labs Review Labs Reviewed - No data to display  Imaging Review Dg Ankle Complete Left  06/06/2015  CLINICAL DATA:  Landed wrong on LEFT ankle playing basketball today, LEFT ankle pain and swelling, initial encounter EXAM: LEFT ANKLE COMPLETE - 3+ VIEW COMPARISON:  None FINDINGS: Osseous mineralization normal. Joint spaces preserved. No fracture, dislocation, or bone destruction. IMPRESSION: Normal exam. Electronically Signed   By: Ulyses Southward M.D.   On: 06/06/2015 21:56   I have personally reviewed and evaluated these images and lab results as part of my medical decision-making.   EKG Interpretation None      MDM   Final diagnoses:  Achilles rupture, left, initial encounter     Patient presents with left ankle pain that occurred after getting stepped on while playing basketball. VSS. Exam revealed tenderness to left distal Achilles with mild swelling, negative Thompson's test. Left leg otherwise neurovascular intact. No other sign of injury to left lower extremity. Left ankle xray negative. Pt given pain meds in the ED. I suspect possible achilles tendon rupture. Posterior short leg splint applied to left leg in the ED and pt given crutches. Discussed results and plan for discharge with patient and family. Advised patient to call his orthopedist tomorrow morning to schedule a follow-up appointment for this week. Discussed symptomatic treatment including pain  meds, NSAIDs and RICE protocol. Discussed strict return precautions with patient. Patient and patient's parents report understanding and agreement of plan.  I personally performed the services described in this documentation, which was scribed in my presence. The recorded information has been reviewed and is accurate.   Satira Sark [place], [place] 06/06/15 2308  Nelva Nay, MD 06/08/15 [id number]

## 2015-06-09 ENCOUNTER — Other Ambulatory Visit: Payer: Self-pay | Admitting: Orthopaedic Surgery

## 2015-06-12 ENCOUNTER — Encounter (HOSPITAL_COMMUNITY): Payer: Self-pay | Admitting: *Deleted

## 2015-06-12 MED ORDER — CEFAZOLIN SODIUM-DEXTROSE 2-4 GM/100ML-% IV SOLN
2.0000 g | INTRAVENOUS | Status: AC
  Administered 2015-06-13: 2 g via INTRAVENOUS

## 2015-06-12 NOTE — Progress Notes (Signed)
Pt denies SOB, chest pain, and being under the care of a cardiologist. Pt denies having a stress test, echo and cardiac cath. Pt made aware to stop taking  Aspirin, vitamins, fish oil and herbal medications. Do not take any NSAIDs ie: Ibuprofen, Advil, Naproxen, BC and Goody Powder or any medication containing Aspirin. Pt verbalized understanding of all pre-op instructions. 

## 2015-06-13 ENCOUNTER — Encounter (HOSPITAL_COMMUNITY)
Admission: RE | Disposition: A | Discharge status: Home / Self Care | Payer: Self-pay | Source: Ambulatory Visit | Attending: Orthopaedic Surgery

## 2015-06-13 ENCOUNTER — Encounter (HOSPITAL_COMMUNITY): Payer: Self-pay | Admitting: Surgery

## 2015-06-13 ENCOUNTER — Ambulatory Visit (HOSPITAL_COMMUNITY): Payer: 59 | Admitting: Anesthesiology

## 2015-06-13 ENCOUNTER — Observation Stay (HOSPITAL_COMMUNITY)
Admission: RE | Admit: 2015-06-13 | Discharge: 2015-06-14 | Disposition: A | Discharge status: Home / Self Care | Payer: 59 | Source: Ambulatory Visit | Attending: Orthopaedic Surgery | Admitting: Orthopaedic Surgery

## 2015-06-13 ENCOUNTER — Ambulatory Visit: Admit: 2015-06-13 | Payer: 59 | Admitting: Orthopaedic Surgery

## 2015-06-13 DIAGNOSIS — X58XXXA Exposure to other specified factors, initial encounter: Secondary | ICD-10-CM | POA: Diagnosis not present

## 2015-06-13 DIAGNOSIS — S86019A Strain of unspecified Achilles tendon, initial encounter: Secondary | ICD-10-CM | POA: Diagnosis present

## 2015-06-13 DIAGNOSIS — S86012A Strain of left Achilles tendon, initial encounter: Secondary | ICD-10-CM | POA: Diagnosis not present

## 2015-06-13 HISTORY — PX: ACHILLES TENDON SURGERY: SHX542

## 2015-06-13 HISTORY — DX: Pneumonia, unspecified organism: J18.9

## 2015-06-13 HISTORY — DX: Headache, unspecified: R51.9

## 2015-06-13 HISTORY — DX: Strain of unspecified achilles tendon, initial encounter: S86.019A

## 2015-06-13 HISTORY — DX: Other complications of anesthesia, initial encounter: T88.59XA

## 2015-06-13 HISTORY — DX: Headache: R51

## 2015-06-13 HISTORY — DX: Adverse effect of unspecified anesthetic, initial encounter: T41.45XA

## 2015-06-13 SURGERY — REPAIR, TENDON, ACHILLES
Anesthesia: General | Laterality: Left

## 2015-06-13 SURGERY — REPAIR, TENDON, ACHILLES
Anesthesia: General | Site: Leg Lower | Laterality: Left

## 2015-06-13 MED ORDER — EPHEDRINE 5 MG/ML INJ
INTRAVENOUS | Status: AC
  Filled 2015-06-13: qty 20

## 2015-06-13 MED ORDER — OXYCODONE-ACETAMINOPHEN 5-325 MG PO TABS
1.0000 | ORAL_TABLET | ORAL | Status: DC | PRN
  Administered 2015-06-14 (×2): 1 via ORAL
  Filled 2015-06-13 (×2): qty 1

## 2015-06-13 MED ORDER — FENTANYL CITRATE (PF) 100 MCG/2ML IJ SOLN
INTRAMUSCULAR | Status: AC
  Administered 2015-06-13: 100 ug
  Filled 2015-06-13: qty 2

## 2015-06-13 MED ORDER — MIDAZOLAM HCL 2 MG/2ML IJ SOLN
INTRAMUSCULAR | Status: AC
  Filled 2015-06-13: qty 2

## 2015-06-13 MED ORDER — FENTANYL CITRATE (PF) 100 MCG/2ML IJ SOLN
INTRAMUSCULAR | Status: AC
  Filled 2015-06-13: qty 2

## 2015-06-13 MED ORDER — ONDANSETRON HCL 4 MG PO TABS: 4.0000 mg | ORAL_TABLET | Freq: Four times a day (QID) | ORAL | Status: DC | PRN

## 2015-06-13 MED ORDER — ONDANSETRON HCL 4 MG/2ML IJ SOLN
INTRAMUSCULAR | Status: AC
  Filled 2015-06-13: qty 2

## 2015-06-13 MED ORDER — LACTATED RINGERS IV SOLN
INTRAVENOUS | Status: DC
  Administered 2015-06-13: 20:00:00 via INTRAVENOUS

## 2015-06-13 MED ORDER — HYDROMORPHONE HCL 1 MG/ML IJ SOLN: 0.2500 mg | INTRAMUSCULAR | Status: DC | PRN

## 2015-06-13 MED ORDER — FENTANYL CITRATE (PF) 250 MCG/5ML IJ SOLN
INTRAMUSCULAR | Status: AC
  Filled 2015-06-13: qty 5

## 2015-06-13 MED ORDER — MIDAZOLAM HCL 5 MG/5ML IJ SOLN
INTRAMUSCULAR | Status: DC | PRN
  Administered 2015-06-13: 2 mg via INTRAVENOUS

## 2015-06-13 MED ORDER — ROCURONIUM BROMIDE 100 MG/10ML IV SOLN
INTRAVENOUS | Status: DC | PRN
  Administered 2015-06-13: 60 mg via INTRAVENOUS

## 2015-06-13 MED ORDER — ONDANSETRON HCL 4 MG/2ML IJ SOLN
INTRAMUSCULAR | Status: DC | PRN
  Administered 2015-06-13: 4 mg via INTRAVENOUS

## 2015-06-13 MED ORDER — CEFAZOLIN SODIUM-DEXTROSE 2-4 GM/100ML-% IV SOLN
INTRAVENOUS | Status: AC
  Filled 2015-06-13: qty 100

## 2015-06-13 MED ORDER — LACTATED RINGERS IV SOLN
INTRAVENOUS | Status: DC | PRN
  Administered 2015-06-13: 17:00:00 via INTRAVENOUS

## 2015-06-13 MED ORDER — DEXTROSE 5 % IV SOLN
INTRAVENOUS | Status: DC | PRN
  Administered 2015-06-13: 18:00:00 via INTRAVENOUS

## 2015-06-13 MED ORDER — PROPOFOL 10 MG/ML IV BOLUS
INTRAVENOUS | Status: AC
  Filled 2015-06-13: qty 20

## 2015-06-13 MED ORDER — KETOROLAC TROMETHAMINE 15 MG/ML IJ SOLN
15.0000 mg | Freq: Four times a day (QID) | INTRAMUSCULAR | Status: AC
  Administered 2015-06-13 – 2015-06-14 (×4): 15 mg via INTRAVENOUS
  Filled 2015-06-13 (×4): qty 1

## 2015-06-13 MED ORDER — CHLORHEXIDINE GLUCONATE 4 % EX LIQD: 60.0000 mL | Freq: Once | CUTANEOUS | Status: DC

## 2015-06-13 MED ORDER — METHOCARBAMOL 1000 MG/10ML IJ SOLN
500.0000 mg | Freq: Four times a day (QID) | INTRAVENOUS | Status: DC | PRN
  Filled 2015-06-13: qty 5

## 2015-06-13 MED ORDER — FENTANYL CITRATE (PF) 100 MCG/2ML IJ SOLN
INTRAMUSCULAR | Status: DC | PRN
  Administered 2015-06-13: 100 ug via INTRAVENOUS
  Administered 2015-06-13: 50 ug via INTRAVENOUS
  Administered 2015-06-13: 100 ug via INTRAVENOUS

## 2015-06-13 MED ORDER — BUPIVACAINE HCL (PF) 0.5 % IJ SOLN
INTRAMUSCULAR | Status: DC | PRN
  Administered 2015-06-13: 25 mL via PERINEURAL

## 2015-06-13 MED ORDER — SUGAMMADEX SODIUM 200 MG/2ML IV SOLN
INTRAVENOUS | Status: AC
  Filled 2015-06-13: qty 2

## 2015-06-13 MED ORDER — ADULT MULTIVITAMIN W/MINERALS CH
1.0000 | ORAL_TABLET | Freq: Every day | ORAL | Status: DC
  Administered 2015-06-14 (×2): 1 via ORAL
  Filled 2015-06-13 (×2): qty 1

## 2015-06-13 MED ORDER — MIDAZOLAM HCL 2 MG/2ML IJ SOLN
INTRAMUSCULAR | Status: AC
  Administered 2015-06-13: 2 mg
  Filled 2015-06-13: qty 2

## 2015-06-13 MED ORDER — DEXAMETHASONE SODIUM PHOSPHATE 10 MG/ML IJ SOLN
INTRAMUSCULAR | Status: AC
  Filled 2015-06-13: qty 1

## 2015-06-13 MED ORDER — LIDOCAINE-EPINEPHRINE (PF) 1.5 %-1:200000 IJ SOLN
INTRAMUSCULAR | Status: DC | PRN
Start: 2015-06-13 — End: 2015-06-13
  Administered 2015-06-13: 15 mL via PERINEURAL

## 2015-06-13 MED ORDER — METOCLOPRAMIDE HCL 5 MG/ML IJ SOLN: 5.0000 mg | Freq: Three times a day (TID) | INTRAMUSCULAR | Status: DC | PRN

## 2015-06-13 MED ORDER — METOCLOPRAMIDE HCL 5 MG PO TABS: 5.0000 mg | ORAL_TABLET | Freq: Three times a day (TID) | ORAL | Status: DC | PRN

## 2015-06-13 MED ORDER — LIDOCAINE HCL (CARDIAC) 20 MG/ML IV SOLN
INTRAVENOUS | Status: DC | PRN
  Administered 2015-06-13: 60 mg via INTRAVENOUS

## 2015-06-13 MED ORDER — MORPHINE SULFATE (PF) 2 MG/ML IV SOLN: 2.0000 mg | INTRAVENOUS | Status: DC | PRN

## 2015-06-13 MED ORDER — DEXAMETHASONE SODIUM PHOSPHATE 10 MG/ML IJ SOLN
INTRAMUSCULAR | Status: DC | PRN
  Administered 2015-06-13: 10 mg via INTRAVENOUS

## 2015-06-13 MED ORDER — ARTIFICIAL TEARS OP OINT
TOPICAL_OINTMENT | OPHTHALMIC | Status: DC | PRN
  Administered 2015-06-13: 1 via OPHTHALMIC

## 2015-06-13 MED ORDER — KETOROLAC TROMETHAMINE 30 MG/ML IJ SOLN: 30.0000 mg | Freq: Once | INTRAMUSCULAR | Status: DC | PRN

## 2015-06-13 MED ORDER — PROPOFOL 10 MG/ML IV BOLUS
INTRAVENOUS | Status: DC | PRN
  Administered 2015-06-13: 50 mg via INTRAVENOUS
  Administered 2015-06-13: 250 mg via INTRAVENOUS

## 2015-06-13 MED ORDER — SUGAMMADEX SODIUM 200 MG/2ML IV SOLN
INTRAVENOUS | Status: DC | PRN
  Administered 2015-06-13: 200 mg via INTRAVENOUS

## 2015-06-13 MED ORDER — 0.9 % SODIUM CHLORIDE (POUR BTL) OPTIME
TOPICAL | Status: DC | PRN
  Administered 2015-06-13: 1000 mL

## 2015-06-13 MED ORDER — METHOCARBAMOL 500 MG PO TABS
500.0000 mg | ORAL_TABLET | Freq: Four times a day (QID) | ORAL | Status: DC | PRN
Start: 2015-06-13 — End: 2015-06-14

## 2015-06-13 MED ORDER — ONDANSETRON HCL 4 MG/2ML IJ SOLN: 4.0000 mg | Freq: Four times a day (QID) | INTRAMUSCULAR | Status: DC | PRN

## 2015-06-13 SURGICAL SUPPLY — 54 items
APL SKNCLS STERI-STRIP NONHPOA (GAUZE/BANDAGES/DRESSINGS) ×1
BANDAGE ACE 4X5 VEL STRL LF (GAUZE/BANDAGES/DRESSINGS) ×2 IMPLANT
BANDAGE ACE 6X5 VEL STRL LF (GAUZE/BANDAGES/DRESSINGS) ×2 IMPLANT
BENZOIN TINCTURE PRP APPL 2/3 (GAUZE/BANDAGES/DRESSINGS) ×2 IMPLANT
BLADE SURG 10 STRL SS (BLADE) ×3 IMPLANT
BNDG CMPR 9X4 STRL LF SNTH (GAUZE/BANDAGES/DRESSINGS) ×1
BNDG ESMARK 4X9 LF (GAUZE/BANDAGES/DRESSINGS) ×2 IMPLANT
CLOSURE STERI-STRIP 1/2X4 (GAUZE/BANDAGES/DRESSINGS) ×1
CLOSURE WOUND 1/2 X4 (GAUZE/BANDAGES/DRESSINGS) ×1
CLSR STERI-STRIP ANTIMIC 1/2X4 (GAUZE/BANDAGES/DRESSINGS) ×1 IMPLANT
COVER MAYO STAND STRL (DRAPES) ×1 IMPLANT
CUFF TOURNIQUET SINGLE 34IN LL (TOURNIQUET CUFF) ×2 IMPLANT
CUFF TOURNIQUET SINGLE 44IN (TOURNIQUET CUFF) IMPLANT
DRAPE INCISE IOBAN 66X45 STRL (DRAPES) ×3 IMPLANT
DRAPE U-SHAPE 47X51 STRL (DRAPES) ×3 IMPLANT
DRSG ADAPTIC 3X8 NADH LF (GAUZE/BANDAGES/DRESSINGS) ×3 IMPLANT
DRSG PAD ABDOMINAL 8X10 ST (GAUZE/BANDAGES/DRESSINGS) ×2 IMPLANT
DURAPREP 26ML APPLICATOR (WOUND CARE) ×3 IMPLANT
ELECT REM PT RETURN 9FT ADLT (ELECTROSURGICAL) ×3
ELECTRODE REM PT RTRN 9FT ADLT (ELECTROSURGICAL) ×1 IMPLANT
GAUZE SPONGE 4X4 12PLY STRL (GAUZE/BANDAGES/DRESSINGS) ×2 IMPLANT
GAUZE XEROFORM 1X8 LF (GAUZE/BANDAGES/DRESSINGS) ×2 IMPLANT
GLOVE BIOGEL PI IND STRL 8 (GLOVE) ×2 IMPLANT
GLOVE BIOGEL PI INDICATOR 8 (GLOVE) ×4
GLOVE ORTHO TXT STRL SZ7.5 (GLOVE) ×6 IMPLANT
GOWN STRL REUS W/ TWL LRG LVL3 (GOWN DISPOSABLE) ×1 IMPLANT
GOWN STRL REUS W/ TWL XL LVL3 (GOWN DISPOSABLE) ×1 IMPLANT
GOWN STRL REUS W/TWL 2XL LVL3 (GOWN DISPOSABLE) ×3 IMPLANT
GOWN STRL REUS W/TWL LRG LVL3 (GOWN DISPOSABLE) ×3
GOWN STRL REUS W/TWL XL LVL3 (GOWN DISPOSABLE) ×3
KIT ROOM TURNOVER OR (KITS) ×3 IMPLANT
MANIFOLD NEPTUNE II (INSTRUMENTS) ×3 IMPLANT
NDL SUT 6 .5 CRC .975X.05 MAYO (NEEDLE) IMPLANT
NEEDLE MAYO TAPER (NEEDLE)
NS IRRIG 1000ML POUR BTL (IV SOLUTION) ×3 IMPLANT
PACK ORTHO EXTREMITY (CUSTOM PROCEDURE TRAY) ×3 IMPLANT
PAD ARMBOARD 7.5X6 YLW CONV (MISCELLANEOUS) ×6 IMPLANT
PAD CAST 4YDX4 CTTN HI CHSV (CAST SUPPLIES) IMPLANT
PADDING CAST COTTON 4X4 STRL (CAST SUPPLIES) ×3
PADDING CAST COTTON 6X4 STRL (CAST SUPPLIES) ×4 IMPLANT
SPLINT PLASTER CAST XFAST 5X30 (CAST SUPPLIES) IMPLANT
SPLINT PLASTER XFAST SET 5X30 (CAST SUPPLIES) ×2
STRIP CLOSURE SKIN 1/2X4 (GAUZE/BANDAGES/DRESSINGS) ×2 IMPLANT
SUCTION FRAZIER HANDLE 10FR (MISCELLANEOUS) ×2
SUCTION TUBE FRAZIER 10FR DISP (MISCELLANEOUS) ×1 IMPLANT
SUT VIC AB 2-0 CT1 27 (SUTURE)
SUT VIC AB 2-0 CT1 TAPERPNT 27 (SUTURE) ×1 IMPLANT
SUT VIC AB 3-0 FS2 27 (SUTURE) IMPLANT
SUT VICRYL 0 TIES 12 18 (SUTURE) IMPLANT
TOWEL OR 17X24 6PK STRL BLUE (TOWEL DISPOSABLE) ×3 IMPLANT
TOWEL OR 17X26 10 PK STRL BLUE (TOWEL DISPOSABLE) ×3 IMPLANT
TUBE CONNECTING 12'X1/4 (SUCTIONS) ×1
TUBE CONNECTING 12X1/4 (SUCTIONS) ×2 IMPLANT
WATER STERILE IRR 1000ML POUR (IV SOLUTION) ×1 IMPLANT

## 2015-06-13 NOTE — Anesthesia Procedure Notes (Addendum)
Anesthesia Regional Block:  Popliteal block  Pre-Anesthetic Checklist: ,, timeout performed, Correct Patient, Correct Site, Correct Laterality, Correct Procedure, Correct Position, site marked, Risks and benefits discussed,  Surgical consent,  Pre-op evaluation,  At surgeon's request and post-op pain management  Laterality: Left  Prep: chloraprep       Needles:  Injection technique: Single-shot  Needle Type: Echogenic Needle     Needle Length: 9cm 9 cm Needle Gauge: 21 and 21 G    Additional Needles:  Procedures: ultrasound guided (picture in chart) Popliteal block Narrative:  Injection made incrementally with aspirations every 5 mL.  Performed by: Personally   Additional Notes: Patient tolerated the procedure well without complications   Procedure Name: Intubation Date/Time: 06/13/2015 5:59 PM Performed by: Wray KearnsFOLEY, Cung Masterson A Pre-anesthesia Checklist: Patient identified, Timeout performed, Emergency Drugs available, Suction available and Patient being monitored Patient Re-evaluated:Patient Re-evaluated prior to inductionOxygen Delivery Method: Circle system utilized Preoxygenation: Pre-oxygenation with 100% oxygen Intubation Type: IV induction and Cricoid Pressure applied Ventilation: Mask ventilation without difficulty Laryngoscope Size: Mac and 4 Grade View: Grade II Tube type: Oral Tube size: 7.5 mm Number of attempts: 1 Airway Equipment and Method: Stylet Placement Confirmation: ETT inserted through vocal cords under direct vision,  breath sounds checked- equal and bilateral and positive ETCO2 Secured at: 23 cm Tube secured with: Tape Dental Injury: Teeth and Oropharynx as per pre-operative assessment

## 2015-06-13 NOTE — Interval H&P Note (Signed)
History and Physical Interval Note:  06/13/2015 5:32 PM  Larry Haas  has presented today for surgery, with the diagnosis of LEFT ACHILLES TENDON  The various methods of treatment have been discussed with the patient and family. After consideration of risks, benefits and other options for treatment, the patient has consented to  Procedure(s): ACHILLES TENDON REPAIR (Left) as a surgical intervention .  The patient's history has been reviewed, patient examined, no change in status, stable for surgery.  I have reviewed the patient's chart and labs.  Questions were answered to the patient's satisfaction.     Sonnet Rizor C

## 2015-06-13 NOTE — Anesthesia Preprocedure Evaluation (Addendum)
Anesthesia Evaluation  Patient identified by MRN, date of birth, ID band Patient awake    Reviewed: Allergy & Precautions, NPO status , Patient's Chart, lab work & pertinent test results  Airway Mallampati: II  TM Distance: >3 FB Neck ROM: Full    Dental no notable dental hx.    Pulmonary neg pulmonary ROS,    Pulmonary exam normal breath sounds clear to auscultation       Cardiovascular negative cardio ROS Normal cardiovascular exam Rhythm:Regular Rate:Normal     Neuro/Psych negative neurological ROS  negative psych ROS   GI/Hepatic negative GI ROS, Neg liver ROS,   Endo/Other  negative endocrine ROS  Renal/GU negative Renal ROS  negative genitourinary   Musculoskeletal negative musculoskeletal ROS (+)   Abdominal   Peds negative pediatric ROS (+)  Hematology negative hematology ROS (+)   Anesthesia Other Findings   Reproductive/Obstetrics negative OB ROS                             Anesthesia Physical Anesthesia Plan  ASA: I  Anesthesia Plan: General   Post-op Pain Management: GA combined w/ Regional for post-op pain   Induction: Intravenous  Airway Management Planned: Oral ETT  Additional Equipment:   Intra-op Plan:   Post-operative Plan: Extubation in OR  Informed Consent: I have reviewed the patients History and Physical, chart, labs and discussed the procedure including the risks, benefits and alternatives for the proposed anesthesia with the patient or authorized representative who has indicated his/her understanding and acceptance.   Dental advisory given  Plan Discussed with: CRNA and Surgeon  Anesthesia Plan Comments: (Patient will be prone)       Anesthesia Quick Evaluation

## 2015-06-13 NOTE — Anesthesia Postprocedure Evaluation (Signed)
Anesthesia Post Note  Patient: Larry Haas  Procedure(s) Performed: Procedure(s) (LRB): ACHILLES TENDON REPAIR (Left)  Patient location during evaluation: PACU Anesthesia Type: General Level of consciousness: awake and alert Pain management: pain level controlled Vital Signs Assessment: post-procedure vital signs reviewed and stable Respiratory status: spontaneous breathing, nonlabored ventilation, respiratory function stable and patient connected to nasal cannula oxygen Cardiovascular status: blood pressure returned to baseline and stable Postop Assessment: no signs of nausea or vomiting Anesthetic complications: no    Last Vitals:  Filed Vitals:   06/13/15 1945 06/13/15 2046  BP: 127/71 121/61  Pulse: 73 60  Temp: 36.5 C 36.9 C  Resp: 12 17    Last Pain: There were no vitals filed for this visit.               Shelton SilvasKevin D Hollis

## 2015-06-13 NOTE — Brief Op Note (Signed)
06/13/2015  7:05 PM  PATIENT:  Ellen HenriKullen Samuel Goding  20 y.o. male  PRE-OPERATIVE DIAGNOSIS:  LEFT ACHILLES TENDON RUPTURE  POST-OPERATIVE DIAGNOSIS:  LEFT ACHILLES TENDON RUPTURE  PROCEDURE:  Procedure(s): ACHILLES TENDON REPAIR (Left)  SURGEON:  Surgeon(s) and Role:    * Eldred MangesMark C Blayton Huttner, MD - Primary  PHYSICIAN ASSISTANT:   ASSISTANTS: none   ANESTHESIA:   general  EBL:     BLOOD ADMINISTERED:none  DRAINS: none   LOCAL MEDICATIONS USED:  NONE  SPECIMEN:  No Specimen  DISPOSITION OF SPECIMEN:  N/A  COUNTS:  YES  TOURNIQUET:   Total Tourniquet Time Documented: Thigh (Left) - 27 minutes Total: Thigh (Left) - 27 minutes   DICTATION: .Other Dictation: Dictation Number [id number]  PLAN OF CARE: Admit for overnight observation  PATIENT DISPOSITION:  PACU - hemodynamically stable.   Delay start of Pharmacological VTE agent (>24hrs) due to surgical blood loss or risk of bleeding: yes

## 2015-06-13 NOTE — Progress Notes (Signed)
Patient ID: Larry Haas, male   DOB: 01/02/1996, 20 y.o.   MRN: [id number] Plan discharge Wed if does well with PT in AM.

## 2015-06-13 NOTE — Transfer of Care (Signed)
Immediate Anesthesia Transfer of Care Note  Patient: Larry Haas  Procedure(s) Performed: Procedure(s): ACHILLES TENDON REPAIR (Left)  Patient Location: PACU  Anesthesia Type:GA combined with regional for post-op pain  Level of Consciousness: awake, oriented, sedated, patient cooperative and responds to stimulation  Airway & Oxygen Therapy: Patient Spontanous Breathing and Patient connected to nasal cannula oxygen  Post-op Assessment: Report given to RN, Post -op Vital signs reviewed and stable, Patient moving all extremities and Patient moving all extremities X 4  Post vital signs: Reviewed and stable  Last Vitals:  Filed Vitals:   06/13/15 1415 06/13/15 1915  BP: 135/74 135/80  Pulse: 69 100  Temp: 37.2 C 36.5 C  Resp: 18 13    Last Pain: There were no vitals filed for this visit.    Patients Stated Pain Goal: 4 (06/13/15 1422)  Complications: No apparent anesthesia complications

## 2015-06-13 NOTE — H&P (Signed)
Larry Haas is an 20 y.o. male.   Chief Complaint:  Left Achilles Tendon rupture HPI: 20 yo basketball injury one week ago with complete achilles rupture.   Past Medical History  Diagnosis Date  . Allergy   . Pneumonia   . Headache   . Achilles tendon rupture     left  . Complication of anesthesia     Stop breathing twice after having tonsils removed and started "refluxing"    Past Surgical History  Procedure Laterality Date  . Tonsillectomy    . Appendectomy    . Hernia repair      At 826 months old     Family History  Problem Relation Age of Onset  . Hyperlipidemia Mother   . Hypertension Mother   . Diabetes Maternal Grandmother   . Hyperlipidemia Maternal Grandmother   . Hypertension Maternal Grandmother   . Cancer Maternal Grandfather   . Heart disease Maternal Grandfather   . Hyperlipidemia Maternal Grandfather   . Hypertension Maternal Grandfather   . Diabetes Paternal Grandmother   . Hyperlipidemia Paternal Grandfather   . Hypertension Paternal Grandfather   . Mental illness Paternal Grandfather    Social History:  reports that he has never smoked. He has never used smokeless tobacco. He reports that he drinks alcohol. He reports that he does not use illicit drugs.  Allergies: No Known Allergies  Medications Prior to Admission  Medication Sig Dispense Refill  . ibuprofen (ADVIL,MOTRIN) 600 MG tablet Take 1 tablet (600 mg total) by mouth every 6 (six) hours as needed. 30 tablet 0  . Multiple Vitamin (MULTIVITAMIN WITH MINERALS) TABS tablet Take 1 tablet by mouth daily.    Marland Kitchen. oxyCODONE (ROXICODONE) 5 MG immediate release tablet Take 1 tablet (5 mg total) by mouth every 4 (four) hours as needed for severe pain. 20 tablet 0    No results found for this or any previous visit (from the past 48 hour(s)). No results found.  Review of Systems  Constitutional: Negative.   HENT: Negative.        T and A in past  Eyes: Negative.   Respiratory: Negative.    Cardiovascular: Negative.   Genitourinary: Negative.        Double hernia repain   Skin: Negative.   Neurological: Negative.   Psychiatric/Behavioral: Negative.     Blood pressure 135/74, pulse 69, temperature 98.9 F (37.2 Haas), temperature source Oral, resp. rate 18, height 6' (1.829 m), weight 112.038 kg (247 lb), SpO2 98 %. Physical Exam  Constitutional: He is oriented to person, place, and time. He appears well-developed and well-nourished.  HENT:  Head: Normocephalic.  Eyes: Pupils are equal, round, and reactive to light.  Neck: Normal range of motion.  Cardiovascular: Normal rate and regular rhythm.   Respiratory: Effort normal.  GI: Soft.  Musculoskeletal:  Left positive thompson test. Palp defect in Achilles left  Neurological: He is alert and oriented to person, place, and time.  Psychiatric: He has a normal mood and affect. His behavior is normal. Judgment and thought content normal.     Assessment/Plan Left Achilles tendon rupture. For repair  Larry MangesYATES,Larry Durkee C, MD 06/13/2015, 5:28 PM

## 2015-06-14 ENCOUNTER — Encounter (HOSPITAL_COMMUNITY): Payer: Self-pay | Admitting: Orthopaedic Surgery

## 2015-06-14 DIAGNOSIS — S86012A Strain of left Achilles tendon, initial encounter: Secondary | ICD-10-CM | POA: Diagnosis not present

## 2015-06-14 MED ORDER — METHOCARBAMOL 500 MG PO TABS: 500.0000 mg | ORAL_TABLET | Freq: Four times a day (QID) | ORAL | Status: DC | PRN

## 2015-06-14 MED ORDER — OXYCODONE-ACETAMINOPHEN 5-325 MG PO TABS: 1.0000 | ORAL_TABLET | Freq: Four times a day (QID) | ORAL | Status: DC | PRN

## 2015-06-14 NOTE — Progress Notes (Signed)
Subjective: Doing well. Pain controlled.  Wants to go home.   Objective: Vital signs in last 24 hours: Temp:  [97.7 F (36.5 C)-98.9 F (37.2 C)] 97.7 F (36.5 C) (05/24 0425) Pulse Rate:  [59-89] 59 (05/24 0425) Resp:  [11-18] 18 (05/24 0425) BP: (118-135)/(61-80) 128/75 mmHg (05/24 0425) SpO2:  [98 %-100 %] 98 % (05/24 0425) Weight:  [112.038 kg (247 lb)] 112.038 kg (247 lb) (05/23 1415)  Intake/Output from previous day: 05/23 0701 - 05/24 0700 In: 1140 [P.O.:240; I.V.:900] Out: 210 [Urine:200; Blood:10] Intake/Output this shift: Total I/O In: 240 [P.O.:240] Out: -   No results for input(s): HGB in the last 72 hours. No results for input(s): WBC, RBC, HCT, PLT in the last 72 hours. No results for input(s): NA, K, CL, CO2, BUN, CREATININE, GLUCOSE, CALCIUM in the last 72 hours. No results for input(s): LABPT, INR in the last 72 hours.  Exam:  Alert and oriented.  Splint intact.    Assessment/Plan: Will d/c home today after safe ambulation with crutches.  Strict NWB left foot.  F/u in office one week.  Scripts for percocet and robaxin on chart.   Marina Boerner M 06/14/2015, 8:55 AM

## 2015-06-14 NOTE — Evaluation (Signed)
Physical Therapy Evaluation Patient Details Name: Larry Haas MRN: [id number] DOB: 03-01-1995 Today's Date: 06/14/2015   History of Present Illness  Pt is a 20 y.o. male s/p ACHILLES TENDON REPAIR (Left). No pertinent PMHx.   Clinical Impression  Pt very motivated and has been on crutches for ~1 week PTA.  Pt needs cues for safety and to slow down, but will be staying with his parents initially at D/C.  No further PT needs at this time, will sign off.      Follow Up Recommendations No PT follow up;Supervision - Intermittent    Equipment Recommendations  None recommended by PT    Recommendations for Other Services       Precautions / Restrictions Precautions Precautions: Fall Restrictions Weight Bearing Restrictions: Yes LLE Weight Bearing: Non weight bearing      Mobility  Bed Mobility Overal bed mobility: Modified Independent             General bed mobility comments: pt sitting in recliner.  Transfers Overall transfer level: Needs assistance Equipment used: Crutches Transfers: Sit to/from Stand Sit to Stand: Supervision         General transfer comment: cues to slow down as pt tends to rush and cause himself to get off balance.    Ambulation/Gait Ambulation/Gait assistance: Supervision Ambulation Distance (Feet): 20 Feet (x2) Assistive device: Crutches Gait Pattern/deviations: Step-to pattern     General Gait Details: pt demonstrates good use of crutches with only cueing to slow down for safety.    Stairs Stairs: Yes Stairs assistance: Min guard Stair Management: No rails;Step to pattern;Forwards;With crutches Number of Stairs: 12 General stair comments: cues for safe technique with crutches only.  pt able to perform without physical A, but guarding needed for safety.    Wheelchair Mobility    Modified Rankin (Stroke Patients Only)       Balance Overall balance assessment: Needs assistance Sitting-balance support: No upper extremity  supported;Feet supported Sitting balance-Leahy Scale: Good     Standing balance support: Single extremity supported;During functional activity Standing balance-Leahy Scale: Poor                               Pertinent Vitals/Pain Pain Assessment: No/denies pain    Home Living Family/patient expects to be discharged to:: Private residence Living Arrangements: Parent Available Help at Discharge: Family;Available 24 hours/day Type of Home: House Home Access: Stairs to enter   [employer] of Steps: 5-6 Home Layout: Two level;Bed/bath upstairs Home Equipment: Crutches Additional Comments: Pt lives in a third story apartment with roommates, has tub shower. Plan is to initially d/c to parents home. Above information reflects pts parents home    Prior Function Level of Independence: Independent with assistive device(s)         Comments: Has been managing independently with use of crutches for the past week since injury.     Hand Dominance        Extremity/Trunk Assessment   Upper Extremity Assessment: Defer to OT evaluation           Lower Extremity Assessment: LLE deficits/detail   LLE Deficits / Details: WFL except where in splint and sensation is dull due to nerve block.    Cervical / Trunk Assessment: Normal  Communication   Communication: No difficulties  Cognition Arousal/Alertness: Awake/alert Behavior During Therapy: WFL for tasks assessed/performed Overall Cognitive Status: Within Functional Limits for tasks assessed  General Comments      Exercises        Assessment/Plan    PT Assessment Patent does not need any further PT services  PT Diagnosis Difficulty walking   PT Problem List    PT Treatment Interventions     PT Goals (Current goals can be found in the Care Plan section) Acute Rehab PT Goals Patient Stated Goal: home PT Goal Formulation: All assessment and education complete, DC  therapy    Frequency     Barriers to discharge        Co-evaluation               End of Session Equipment Utilized During Treatment: Gait belt Activity Tolerance: Patient tolerated treatment well Patient left: in chair;with call bell/phone within reach Nurse Communication: Mobility status    Functional Assessment Tool Used: Clinical Judgement Functional Limitation: Mobility: Walking and moving around Mobility: Walking and Moving Around Current Status ([id number](G8978): At least 1 percent but less than 20 percent impaired, limited or restricted Mobility: Walking and Moving Around Goal Status [phone](G8979): At least 1 percent but less than 20 percent impaired, limited or restricted Mobility: Walking and Moving Around Discharge Status [id number](G8980): At least 1 percent but less than 20 percent impaired, limited or restricted    Time: 1009-1034 PT Time Calculation (min) (ACUTE ONLY): 25 min   Charges:   PT Evaluation $PT Eval Low Complexity: 1 Procedure     PT G Codes:   PT G-Codes **NOT FOR INPATIENT CLASS** Functional Assessment Tool Used: Clinical Judgement Functional Limitation: Mobility: Walking and moving around Mobility: Walking and Moving Around Current Status (B1478(G8978): At least 1 percent but less than 20 percent impaired, limited or restricted Mobility: Walking and Moving Around Goal Status [phone](G8979): At least 1 percent but less than 20 percent impaired, limited or restricted Mobility: Walking and Moving Around Discharge Status [phone](G8980): At least 1 percent but less than 20 percent impaired, limited or restricted    Sunny SchleinRitenour, Jadis Pitter F, [place] CarolinaPT [phone] 06/14/2015, 12:01 PM

## 2015-06-14 NOTE — Evaluation (Signed)
Occupational Therapy Evaluation Patient Details Name: Larry Haas MRN: [id number] DOB: [birth year] Today's Date: 06/14/2015    History of Present Illness Pt is a 20 y.o. male s/p ACHILLES TENDON REPAIR (Left). No pertinent PMHx.    Clinical Impression   Pt reports he has been managing ADLs independently since injury ~1 week ago. Currently pt overall min guard for safety with functional mobility and supervision for safety with ADLs in sitting. Educated pt on home safety and energy conservation strategies. Pt planning to d/c to his parents home initially where he will have 24/7 supervision; plan to return to his apartment after initial recovery. Pt would benefit from continued skilled OT to address established goals.    Follow Up Recommendations  No OT follow up;Supervision - Intermittent    Equipment Recommendations  3 in 1 bedside comode    Recommendations for Other Services PT consult     Precautions / Restrictions Precautions Precautions: Fall Restrictions Weight Bearing Restrictions: Yes LLE Weight Bearing: Non weight bearing      Mobility Bed Mobility Overal bed mobility: Modified Independent                Transfers Overall transfer level: Needs assistance Equipment used: Crutches Transfers: Sit to/from Stand Sit to Stand: Min assist         General transfer comment: Min assist initially to steady for balance once cruches in place min guard for safety.    Balance Overall balance assessment: Needs assistance Sitting-balance support: Feet supported;No upper extremity supported Sitting balance-Leahy Scale: Good     Standing balance support: Single extremity supported Standing balance-Leahy Scale: Poor                              ADL Overall ADL's : Needs assistance/impaired Eating/Feeding: Independent;Sitting   Grooming: Min guard;Standing   Upper Body Bathing: Set up;Sitting   Lower Body Bathing: Supervison/  safety;Sitting/lateral leans   Upper Body Dressing : Set up;Sitting   Lower Body Dressing: Supervision/safety;Sitting/lateral leans Lower Body Dressing Details (indicate cue type and reason): Educated pt on L leg first into clothing. Toilet Transfer: Min guard;Ambulation;Regular Toilet (crutches) Toilet Transfer Details (indicate cue type and reason): Simulated by transfer from EOB to chair         Functional mobility during ADLs: Supervision/safety (crutches) General ADL Comments: Educated pt on home safety, energy conservation techniques for kitchen/bathroom, discussed use of 3 in 1 in tub as a seat vs sitting in bottom of bath tub.     Vision     Perception     Praxis      Pertinent Vitals/Pain Pain Assessment: No/denies pain (pt reports nerve block has not worn off)     Hand Dominance     Extremity/Trunk Assessment Upper Extremity Assessment Upper Extremity Assessment: Overall WFL for tasks assessed   Lower Extremity Assessment Lower Extremity Assessment: Defer to PT evaluation   Cervical / Trunk Assessment Cervical / Trunk Assessment: Normal   Communication Communication Communication: No difficulties   Cognition Arousal/Alertness: Awake/alert Behavior During Therapy: WFL for tasks assessed/performed Overall Cognitive Status: Within Functional Limits for tasks assessed                     General Comments       Exercises       Shoulder Instructions      Home Living Family/patient expects to be discharged to:: Private residence Living Arrangements: Parent Available Help  at Discharge: Family;Available 24 hours/day Type of Home: House Home Access: Stairs to enter [employer]trance Stairs-Number of Steps: 5-6   Home Layout: Two level;Bed/bath upstairs     Bathroom Shower/Tub: Producer, television/film/videoWalk-in shower   Bathroom Toilet: Standard     Home Equipment: Crutches   Additional Comments: Pt lives in a third story apartment with roommates, has tub shower. Plan is  to initially d/c to parents home. Above information reflects pts parents home      Prior Functioning/Environment Level of Independence: Independent with assistive device(s)        Comments: Has been managing independently with use of crutches for the past week since injury.    OT Diagnosis: Acute pain   OT Problem List: Impaired balance (sitting and/or standing);Decreased knowledge of use of DME or AE;Decreased knowledge of precautions;Pain   OT Treatment/Interventions: Self-care/ADL training;Energy conservation;DME and/or AE instruction;Therapeutic activities;Patient/family education;Balance training    OT Goals(Current goals can be found in the care plan section) Acute Rehab OT Goals Patient Stated Goal: home OT Goal Formulation: With patient Time For Goal Achievement: 06/28/15 Potential to Achieve Goals: Good ADL Goals Pt Will Transfer to Toilet: with modified independence;ambulating;regular height toilet Pt Will Perform Toileting - Clothing Manipulation and hygiene: with modified independence;sit to/from stand Pt Will Perform Tub/Shower Transfer: Tub transfer;with modified independence;ambulating;3 in 1  OT Frequency: Min 2X/week   Barriers to D/C: Inaccessible home environment          Co-evaluation              End of Session Equipment Utilized During Treatment: Other (comment) (crutches)  Activity Tolerance: Patient tolerated treatment well Patient left: in chair;with call bell/phone within reach;Other (comment) (with PT)   Time: 5409-81190957-1011 OT Time Calculation (min): 14 min Charges:  OT General Charges $OT Visit: 1 Procedure OT Evaluation $OT Eval Low Complexity: 1 Procedure G-Codes: OT G-codes **NOT FOR INPATIENT CLASS** Functional Assessment Tool Used: Clinical judgement Functional Limitation: Self care Self Care Current Status ([id number](G8987): At least 1 percent but less than 20 percent impaired, limited or restricted Self Care Goal Status (N5621(G8988): At least 1  percent but less than 20 percent impaired, limited or restricted   Gaye AlkenBailey A Jonette Wassel M.S., OTR/L Pager: [phone]  06/14/2015, 10:27 AM

## 2015-06-14 NOTE — Op Note (Signed)
NAMGaspar Bidding:  Larry Haas, Larry Haas                ACCOUNT NO.:  [id number]  MEDICAL RECORD NO.:  [id number]  LOCATION:  5N03C                        FACILITY:  [facility]  PHYSICIAN:  Hilda Wexler C. Ophelia CharterYates, M.D.    DATE OF BIRTH:  [birth year]  DATE OF PROCEDURE:  06/13/2015 DATE OF DISCHARGE:                              OPERATIVE REPORT   PREOPERATIVE DIAGNOSIS:  Left Achilles tendon rupture.  POSTOPERATIVE DIAGNOSIS:  Left Achilles tendon rupture.  PROCEDURE:  Left Achilles tendon repair.  SURGEON:  Eboney Claybrook C. Ophelia CharterYates, M.D.  TOURNIQUET TIME:  Less than an hour x350.  COMPLICATIONS:  None.  ANESTHESIA:  General.  DESCRIPTION OF PROCEDURE:  After induction of general anesthesia, orotracheal intubation, the patient was placed prone, careful padding and positioning.  Proximal thigh tourniquet was applied and toes, foot, ankle prepping up to the knee.  Extremity sheets and drapes were applied.  Sterile skin marker was used, incision was made along the lateral aspect of the Achilles tendon.  Careful blunt dissection was performed, and the sural nerve was identified first and carefully protected.  Sheath was intact, sheath was opened, there was hematoma where the Achilles tendon rupture was present, and #2 FiberWire was used, total of 6-8 sutures were placed with __________ type suture repair sewing the 2 ends of the tendon together.  There was less shredding, this is more of a transverse tear than is typical.  Afterward some running suture was used, nonabsorbable, over the top smooth edges, and then the sheath was closed with 2-0 Vicryl interrupted.  2-0 Vicryl in the subcutaneous tissues.  Sural nerve again protected as well as the lesser saphenous vein adjacent to it.  Irrigation with saline solution prior to closure and running 3-0 Vicryl subcuticular skin closure. Tincture of benzoin, Steri-Strips, Xeroform, 4x4s, Webril, and short-leg splint with the foot plantar flexed.  The patient tolerated  the procedure well.  Instrument count and needle count were correct.     Daimen Shovlin C. Ophelia CharterYates, M.D.     MCY/MEDQ  D:  06/13/2015  T:  06/13/2015  Job:  [id number]

## 2015-06-14 NOTE — Progress Notes (Signed)
Larry Haas to be D/C'd Home per MD order.  Discussed with the patient and all questions fully answered.  VSS, Skin clean, dry and intact without evidence of skin break down, no evidence of skin tears noted. IV catheter discontinued intact. Site without signs and symptoms of complications. Dressing and pressure applied.  An After Visit Summary was printed and given to the patient. Patient received prescription.  D/c education completed with patient and mother including follow up instructions, medication list, d/c activities limitations if indicated, with other d/c instructions as indicated by MD - patient able to verbalize understanding, all questions fully answered.   Patient instructed to return to ED, call 911, or call MD for any changes in condition.   Patient will be escorted via WC, and D/C home via private auto.  Larry Haas 06/14/2015 3:00 PM

## 2015-06-23 NOTE — Discharge Summary (Signed)
Patient ID: Larry Haas DOB/AGE: 21/02/1995 21 y.o.  Admit date: 06/13/2015 Discharge date: 06/23/2015  Admission Diagnoses:  Active Problems:   Rupture of left Achilles tendon   Achilles tendon rupture   Discharge Diagnoses:  Active Problems:   Rupture of left Achilles tendon   Achilles tendon rupture  status post Procedure(s): ACHILLES TENDON REPAIR  Past Medical History  Diagnosis Date  . Allergy   . Pneumonia   . Headache   . Achilles tendon rupture     left  . Complication of anesthesia     Stop breathing twice after having tonsils removed and started "refluxing"    Surgeries: Procedure(s): ACHILLES TENDON REPAIR on 06/13/2015   Consultants:    Discharged Condition: Improved  Hospital Course: Larry Haas is an 20 y.o. male who was admitted 06/13/2015 for operative treatment of achilles tendon rupture. Patient failed conservative treatments (please see the history and physical for the specifics) and had severe unremitting pain that affects sleep, daily activities and work/hobbies. After pre-op clearance, the patient was taken to the operating room on 06/13/2015 and underwent  Procedure(s): ACHILLES TENDON REPAIR.    Patient was given perioperative antibiotics:  Anti-infectives    Start     Dose/Rate Route Frequency Ordered Stop   06/13/15 1630  ceFAZolin (ANCEF) IVPB 2g/100 mL premix     2 g 200 mL/hr over 30 Minutes Intravenous To ShortStay Surgical 06/12/15 1424 06/13/15 1815   06/13/15 1412  ceFAZolin (ANCEF) 2-4 GM/100ML-% IVPB    Comments:  Macon LargePatterson, [place]   : cabinet override      06/13/15 1412 06/14/15 0214       Patient was given sequential compression devices and early ambulation to prevent DVT.   Patient benefited maximally from hospital stay and there were no complications. At the time of discharge, the patient was urinating/moving their bowels without difficulty, tolerating a regular diet, pain is controlled with  oral pain medications and they have been cleared by PT/OT.   Recent vital signs: No data found.    Recent laboratory studies: No results for input(s): WBC, HGB, HCT, PLT, NA, K, CL, CO2, BUN, CREATININE, GLUCOSE, INR, CALCIUM in the last 72 hours.  Invalid input(s): PT, 2   Discharge Medications:     Medication List    STOP taking these medications        multivitamin with minerals Tabs tablet     oxyCODONE 5 MG immediate release tablet  Commonly known as:  ROXICODONE      TAKE these medications        ibuprofen 600 MG tablet  Commonly known as:  ADVIL,MOTRIN  Take 1 tablet (600 mg total) by mouth every 6 (six) hours as needed.     methocarbamol 500 MG tablet  Commonly known as:  ROBAXIN  Take 1 tablet (500 mg total) by mouth every 6 (six) hours as needed for muscle spasms.     oxyCODONE-acetaminophen 5-325 MG tablet  Commonly known as:  PERCOCET/ROXICET  Take 1-2 tablets by mouth every 6 (six) hours as needed for severe pain.        Diagnostic Studies: Dg Ankle Complete Left  06/06/2015  CLINICAL DATA:  Landed wrong on LEFT ankle playing basketball today, LEFT ankle pain and swelling, initial encounter EXAM: LEFT ANKLE COMPLETE - 3+ VIEW COMPARISON:  None FINDINGS: Osseous mineralization normal. Joint spaces preserved. No fracture, dislocation, or bone destruction. IMPRESSION: Normal exam. Electronically Signed   By: Ulyses SouthwardMark  Boles  M.D.   On: 06/06/2015 21:56        Discharge Instructions    Call MD / Call 911    Complete by:  As directed   If you experience chest pain or shortness of breath, CALL 911 and be transported to the hospital emergency room.  If you develope a fever above 101 F, pus (white drainage) or increased drainage or redness at the wound, or calf pain, call your surgeon's office.     Constipation Prevention    Complete by:  As directed   Drink plenty of fluids.  Prune juice may be helpful.  You may use a stool softener, such as Colace (over the  counter) 100 mg twice a day.  Use MiraLax (over the counter) for constipation as needed.     Diet - low sodium heart healthy    Complete by:  As directed      Discharge instructions    Complete by:  As directed   Do not remove dressing or get wet.  Strict non-weightbearing left leg until further notice.    Elevate foot above heart level as much as possible to decrease swelling and pain.   0-60 degrees, 6-8   Ice of and on prn.     Driving restrictions    Complete by:  As directed   No driving until further notice.     Increase activity slowly as tolerated    Complete by:  As directed      Lifting restrictions    Complete by:  As directed   No lifting           Follow-up Information    Schedule an appointment as soon as possible for a visit with Eldred Manges, MD.   Specialty:  Orthopedic Surgery   Why:  need return office visit one week postop   Contact information:   [address] [place] [address] [phone]       Discharge Plan:  discharge to home  Disposition:     Signed: Naida Sleight for Annell Greening MD   06/23/2015, 6:14 PM

## 2015-06-26 NOTE — Addendum Note (Signed)
Addendum  created 06/26/15 1353 by Eilene GhaziGeorge Zayna Toste, MD   Modules edited: Anesthesia Responsible Staff

## 2015-09-04 ENCOUNTER — Ambulatory Visit: Payer: 59 | Attending: Orthopaedic Surgery | Admitting: Physical Therapy

## 2015-09-04 DIAGNOSIS — M25672 Stiffness of left ankle, not elsewhere classified: Secondary | ICD-10-CM | POA: Diagnosis present

## 2015-09-04 DIAGNOSIS — M6281 Muscle weakness (generalized): Secondary | ICD-10-CM

## 2015-09-04 DIAGNOSIS — R262 Difficulty in walking, not elsewhere classified: Secondary | ICD-10-CM | POA: Diagnosis present

## 2015-09-04 DIAGNOSIS — M25572 Pain in left ankle and joints of left foot: Secondary | ICD-10-CM | POA: Diagnosis present

## 2015-09-05 NOTE — Therapy (Signed)
[facility] [address] [place], [place], [address] Phone: [phone]   Fax:  [phone]  Physical Therapy Evaluation  Patient Details  Name: Larry Haas MRN: [id number] Date of Birth: 11/22/1995 Referring Provider: Dr Annell Greening   Encounter Date: 09/04/2015      PT End of Session - 09/05/15 1139    Visit Number 1   Number of Visits 16   Date for PT Re-Evaluation 09/05/15   PT Start Time 1446   PT Stop Time 1540   PT Time Calculation (min) 54 min   Activity Tolerance Patient tolerated treatment well   Behavior During Therapy [facility] for tasks assessed/performed      Past Medical History:  Diagnosis Date  . Achilles tendon rupture    left  . Allergy   . Complication of anesthesia    Stop breathing twice after having tonsils removed and started "refluxing"  . Headache   . Pneumonia     Past Surgical History:  Procedure Laterality Date  . ACHILLES TENDON SURGERY Left 06/13/2015   Procedure: ACHILLES TENDON REPAIR;  Surgeon: Eldred Manges, MD;  Location: [facility] OR;  Service: Orthopedics;  Laterality: Left;  . APPENDECTOMY    . HERNIA REPAIR     At 42 months old   . TONSILLECTOMY      There were no vitals filed for this visit.       Subjective Assessment - 09/04/15 1509    Subjective Patient was playing basetball when he tore his left achillies. He had it repairedon 06-13-15. He has been in a boot since that point. He has some pain when he is standing and walking.    Pertinent History nothing significant    Limitations Standing;Walking   How long can you sit comfortably? No limited    How long can you stand comfortably? < 1 hour    How long can you walk comfortably? limited community distances    Diagnostic tests Nothing post -op    Patient Stated Goals Back to normal life activity    Currently in Pain? Yes   Pain Score 3    Pain Location Ankle   Pain Orientation Left   Pain Descriptors / Indicators Aching    Pain Onset More than a month ago   Pain Frequency Intermittent   Aggravating Factors  standing/ walking    Pain Relieving Factors rest, ice    Effect of Pain on Daily Activities difficulty walking             [facility] PT Assessment - 09/05/15 0001      Assessment   Medical Diagnosis Left achillies rupture    Referring Provider Dr Annell Greening    Onset Date/Surgical Date 06/13/15   Hand Dominance Right   Next MD Visit 4 weeks   Prior Therapy No     Precautions   Precaution Comments Progress per protocol restrictions    Required Braces or Orthoses Other Brace/Splint   Other Brace/Splint Cam walker boot      Restrictions   Weight Bearing Restrictions Yes   LLE Weight Bearing Weight bearing as tolerated     Balance Screen   Has the patient fallen in the past 6 months No     Home Environment   Additional Comments Lives on 2nd story paprtment      Prior Function   Level of Independence Independent   Vocation Student   Leisure [place], intermurial sports      Cognition  Overall Cognitive Status Within Functional Limits for tasks assessed     Observation/Other Assessments   Observations well healed scar      Observation/Other Assessments-Edema    Edema Figure 8     Figure 8 Edema   Figure 8 - Right  43.4   Figure 8 - Left  45.3     Sensation   Light Touch Appears Intact     Posture/Postural Control   Posture/Postural Control No significant limitations     ROM / Strength   AROM / PROM / Strength AROM;PROM;Strength     AROM   Overall AROM Comments Right lower extremity and left knee and hip WNL    AROM Assessment Site Ankle   Right/Left Ankle Left   Left Ankle Dorsiflexion 5   Left Ankle Plantar Flexion 28   Left Ankle Inversion 27   Left Ankle Eversion 15     PROM   Overall PROM Comments 10 degrees with knee straight and with dorsi flexion    PROM Assessment Site Ankle   Right/Left Ankle Left   Left Ankle Dorsiflexion 10   Left Ankle Plantar  Flexion 32   Left Ankle Inversion 30   Left Ankle Eversion 17     Strength   Strength Assessment Site Ankle   Right/Left Ankle Left   Left Ankle Dorsiflexion 4/5   Left Ankle Plantar Flexion --  Not tested    Left Ankle Inversion 4/5   Left Ankle Eversion 4/5     Palpation   Palpation comment No significant tendenress to palpation      Ambulation/Gait   Gait Comments Without boot patient was vry hesitant to weight bear. As he gained confidence his gait improved. In the boot he has very little antalgic gait. Decreased single leg stance time on the left with boot. Decreased heel strike.      High Level Balance   High Level Balance Comments unable to perfrom single leg stance on the left                    [facility] Treatment/Exercise - 09/05/15 0001      Exercises   Exercises Ankle     Ankle Exercises: Standing   Other Standing Ankle Exercises standing weight shift forward/ side to side   Other Standing Ankle Exercises standing march      Ankle Exercises: Supine   T-Band T-band 4 way with yellow no band for PF    Other Supine Ankle Exercises gastroc stretch with strap 3x20sec                 PT Education - 09/05/15 1134    Education provided Yes   Education Details proper progression to a shoe. Importance of stregthening; HEP.    Person(s) Educated Patient   Methods Explanation;Demonstration   Comprehension Verbalized understanding;Returned demonstration          PT Short Term Goals - 09/05/15 1257      PT SHORT TERM GOAL #1   Title Patient will demsotrate 4+/5 gross left lower extremity pain    Time 4   Period Weeks   Status On-going     PT SHORT TERM GOAL #2   Title Patient will ambulate 300' in the clinic without the brace with proper gait pattern    Time 4   Period Weeks   Status New     PT SHORT TERM GOAL #3   Title Patient will be independent with intial HEP  Time 4   Period Weeks   Status New     PT SHORT TERM GOAL #4    Title Patient will report 2/10 pain at worst    Time 4   Period Weeks   Status New     PT SHORT TERM GOAL #5   Title Patient will demsotrate a 30 second single leg stance time   Time 4   Period Weeks   Status New           PT Long Term Goals - 09/05/15 1259      PT LONG TERM GOAL #1   Title Patientwill demsotrate 20 degrees of active ankle dorsi-flexion in order to quat down to pick item off the ground.    Time 8   Period Weeks   Status New     PT LONG TERM GOAL #2   Title Patient will ambulate 1 mile without pain in order to ambulate around campus    Time 8   Period Weeks   Status New     PT LONG TERM GOAL #3   Title Patient will go up/down 2 flights of stairs with a reciprocol gait pattern and without pain in order to reach his appartment safely.    Time 8   Period Weeks   Status New               Plan - 09/05/15 1146    Clinical Impression Statement Patient is a 20 year old male S/P left achillies repair on 06/13/2015. He presents with decreased left LE strength, stability, and range of motion. His range is passed neutral without pain. Therapy will begin progressing him out of his boot and into a shoe. He was given an HEP. He was seefor a low complexity evaluation.    Rehab Potential Poor   PT Frequency 2x / week   PT Duration 8 weeks   PT Treatment/Interventions ADLs/Self Care Home Management;Cryotherapy;Electrical Stimulation;Iontophoresis 4mg /ml Dexamethasone;Functional mobility training;Stair training;Gait training;Moist Heat;Therapeutic activities;Therapeutic exercise;Balance training;Neuromuscular re-education;Patient/family education;Passive range of motion;Manual techniques;Dry needling;Splinting;Taping;Vasopneumatic Device;Visual/perceptual remediation/compensation   PT Next Visit Plan The patient is not scheduled to come back for 2 weeks. If he waits that long he should be ready to come out of the oot at that time. continue with gait traing. Continue  manual therapy to improve ROM. Continue with stregthening add seated heel raises, seated windshield wiper, standing marching, consider leg press, 4 inch step.    PT Home Exercise Plan standing weight shift, standing march, 4 way ankle,    Consulted and Agree with Plan of Care Patient      Patient will benefit from skilled therapeutic intervention in order to improve the following deficits and impairments:  Abnormal gait, Decreased range of motion, Difficulty walking, Decreased safety awareness, Pain, Increased muscle spasms, Decreased activity tolerance, Decreased strength, Decreased mobility, Decreased balance  Visit Diagnosis: Pain in left ankle and joints of left foot - Plan: PT plan of care cert/re-cert  Stiffness of left ankle, not elsewhere classified - Plan: PT plan of care cert/re-cert  Muscle weakness (generalized) - Plan: PT plan of care cert/re-cert  Difficulty in walking, not elsewhere classified - Plan: PT plan of care cert/re-cert     Problem List Patient Active Problem List   Diagnosis Date Noted  . Rupture of left Achilles tendon 06/13/2015  . Achilles tendon rupture 06/13/2015  . Alopecia 01/03/2015    Dessie Coma PT DPT  09/05/2015, 1:26 PM  [facility] [address]  [address] [place], [place], [address] Phone: [phone]   Fax:  [phone]  Name: Larry Haas Samuel Haas MRN: [id number] Date of Birth: 01/21/1996

## 2015-09-19 ENCOUNTER — Ambulatory Visit: Payer: 59 | Admitting: Physical Therapy

## 2015-09-19 DIAGNOSIS — M25572 Pain in left ankle and joints of left foot: Secondary | ICD-10-CM

## 2015-09-19 DIAGNOSIS — R262 Difficulty in walking, not elsewhere classified: Secondary | ICD-10-CM

## 2015-09-19 DIAGNOSIS — M6281 Muscle weakness (generalized): Secondary | ICD-10-CM

## 2015-09-19 DIAGNOSIS — M25672 Stiffness of left ankle, not elsewhere classified: Secondary | ICD-10-CM

## 2015-09-19 NOTE — Therapy (Signed)
[place], [place], [address] Phone: [phone]   Fax:  [phone]  Physical Therapy Treatment  Patient Details  Name: Larry Haas MRN: [id number] Date of Birth: May 02, 1995 Referring Provider: Dr Rodell Perna   Encounter Date: 09/19/2015      PT End of Session - 09/19/15 1421    Visit Number 2   Number of Visits 16   Date for PT Re-Evaluation 11/02/15   PT Start Time 0215   PT Stop Time 0300   PT Time Calculation (min) 45 min      Past Medical History:  Diagnosis Date  . Achilles tendon rupture    left  . Allergy   . Complication of anesthesia    Stop breathing twice after having tonsils removed and started "refluxing"  . Headache   . Pneumonia     Past Surgical History:  Procedure Laterality Date  . ACHILLES TENDON SURGERY Left 06/13/2015   Procedure: ACHILLES TENDON REPAIR;  Surgeon: Marybelle Killings, MD;  Location: [place];  Service: Orthopedics;  Laterality: Left;  . APPENDECTOMY    . HERNIA REPAIR     At 96 months old   . TONSILLECTOMY      There were no vitals filed for this visit.      Subjective Assessment - 09/19/15 1613    Subjective I have been walking 50% out of the boot. No real pain. I have been doing cardio on the bike   Currently in Pain? No/denies                         [facility] Adult PT Treatment/Exercise - 09/19/15 0001      Ambulation/Gait   Pre-Gait Activities retro walking, exaggerated heel strike, exaggerated toe off   Gait Comments Gait around clinic, pt demonstrates decreased stance time, decreased heel strike and decreased toe off. After cues and pre gait exercies, pt able to reduce antalgic gait     Ankle Exercises: Standing   SLS 2 sec best   Other Standing Ankle Exercises standing weight shift forward/ side to side     Ankle Exercises: Supine   T-Band T band for 3 way green, no band for PF    Other Supine Ankle Exercises gastroc  stretch with strap 3x20sec      Ankle Exercises: Seated   Heel Raises 10 reps   Toe Raise 10 reps     Ankle Exercises: Machines for Strengthening   Cybex Leg Press 75# x 20     Ankle Exercises: Aerobic   Stationary Bike L3 x 5 minutes                   PT Short Term Goals - 09/19/15 1621      PT SHORT TERM GOAL #1   Title Patient will demsotrate 4+/5 gross left lower extremity pain    Time 4   Period Weeks   Status On-going     PT SHORT TERM GOAL #2   Title Patient will ambulate 300' in the clinic without the brace with proper gait pattern    Time 4   Period Weeks   Status Partially Met     PT SHORT TERM GOAL #3   Title Patient will be independent with intial HEP   Time 4   Period Weeks   Status Achieved     PT SHORT TERM GOAL #4   Title Patient will report 2/10  pain at worst    Time 4   Period Weeks   Status On-going     PT SHORT TERM GOAL #5   Title Patient will demsotrate a 30 second single leg stance time   Baseline 2 sec   Time 4   Period Weeks   Status On-going           PT Long Term Goals - 09/05/15 1259      PT LONG TERM GOAL #1   Title Patientwill demsotrate 20 degrees of active ankle dorsi-flexion in order to quat down to pick item off the ground.    Time 8   Period Weeks   Status New     PT LONG TERM GOAL #2   Title Patient will ambulate 1 mile without pain in order to ambulate around campus    Time 8   Period Weeks   Status New     PT LONG TERM GOAL #3   Title Patient will go up/down 2 flights of stairs with a reciprocol gait pattern and without pain in order to reach his appartment safely.    Time 8   Period Weeks   Status New               Plan - 09/19/15 1614    Clinical Impression Statement Pt reports he is eager to get out of the boot completely. Educated him on gradual progression. We focued pregait activities including weight shifting forward and back with exaggerated heel stike and toe off. No increased  pain. pt repors it feels fuuny. I asked him to slow doen his gait and focus on mechanics to decrease limp. Review of 3 way ankle, progressed to green band with and instructed him in other ways to perform independently. He has many questions about gym equipment. Began leg press with good tolerance. Told him he could try knee extension and curls at gym, seated hip machines, just no calf press, squats or lunges. He verbalized understanding. STG# 3 met.    PT Next Visit Plan wean out of boot if toelrated; continue with gait traing. Continue manual therapy to improve ROM. Continue with strengthening add seated heel raises, seated windshield wiper, standing marching, leg press, 4 inch step. mini squats   PT Home Exercise Plan standing weight shift, standing march, 4 way ankle,       Patient will benefit from skilled therapeutic intervention in order to improve the following deficits and impairments:  Abnormal gait, Decreased range of motion, Difficulty walking, Decreased safety awareness, Pain, Increased muscle spasms, Decreased activity tolerance, Decreased strength, Decreased mobility, Decreased balance  Visit Diagnosis: No diagnosis found.     Problem List Patient Active Problem List   Diagnosis Date Noted  . Rupture of left Achilles tendon 06/13/2015  . Achilles tendon rupture 06/13/2015  . Alopecia 01/03/2015    Dorene Ar, PTA 09/19/2015, 4:22 PM  [facility] [address] [place], [place], [address] Phone: [phone]   Fax:  [phone]  Name: Larry Haas MRN: [id number] Date of Birth: December 28, 1995

## 2015-09-21 ENCOUNTER — Ambulatory Visit: Payer: 59 | Admitting: Physical Therapy

## 2015-09-21 DIAGNOSIS — M25572 Pain in left ankle and joints of left foot: Secondary | ICD-10-CM | POA: Diagnosis not present

## 2015-09-21 DIAGNOSIS — M25672 Stiffness of left ankle, not elsewhere classified: Secondary | ICD-10-CM

## 2015-09-21 DIAGNOSIS — R262 Difficulty in walking, not elsewhere classified: Secondary | ICD-10-CM

## 2015-09-21 DIAGNOSIS — M6281 Muscle weakness (generalized): Secondary | ICD-10-CM

## 2015-09-22 NOTE — Therapy (Signed)
[place], [place], [address] Phone: [phone]   Fax:  [phone]  Physical Therapy Treatment  Patient Details  Name: Larry Haas MRN: [id number] Date of Birth: May 22, 1995 Referring Provider: Dr Rodell Perna   Encounter Date: 09/21/2015      PT End of Session - 09/21/15 2213    Visit Number 3   Number of Visits 16   Date for PT Re-Evaluation 11/02/15   PT Start Time 0215   PT Stop Time 0310   PT Time Calculation (min) 55 min   Activity Tolerance Patient tolerated treatment well   Behavior During Therapy [facility] for tasks assessed/performed      Past Medical History:  Diagnosis Date  . Achilles tendon rupture    left  . Allergy   . Complication of anesthesia    Stop breathing twice after having tonsils removed and started "refluxing"  . Headache   . Pneumonia     Past Surgical History:  Procedure Laterality Date  . ACHILLES TENDON SURGERY Left 06/13/2015   Procedure: ACHILLES TENDON REPAIR;  Surgeon: Marybelle Killings, MD;  Location: [place];  Service: Orthopedics;  Laterality: Left;  . APPENDECTOMY    . HERNIA REPAIR     At 15 months old   . TONSILLECTOMY      There were no vitals filed for this visit.      Subjective Assessment - 09/21/15 1416    Subjective Patient has been walking without the boot without much pain. He has no significant pain at this time. He is not walking around campus too much.    Pertinent History nothing significant    Limitations Standing;Walking   How long can you sit comfortably? No limited    How long can you stand comfortably? < 1 hour    How long can you walk comfortably? limited community distances    Diagnostic tests Nothing post -op    Currently in Pain? No/denies                         [facility] Adult PT Treatment/Exercise - 09/21/15 0001      Modalities   Modalities Cryotherapy     Cryotherapy   Number Minutes Cryotherapy 10 Minutes    Cryotherapy Location Ankle   Type of Cryotherapy Ice pack     Manual Therapy   Manual therapy comments PROm into dorsi flexion to improve range of motion and decrease " tight feeling" when ambulating.      Ankle Exercises: Machines for Strengthening   Cybex Leg Press 75# x 20     Ankle Exercises: Standing   SLS 15 seconds x3 with 1 finger support bilateral    Other Standing Ankle Exercises mini squat in pain free range 2x10; step up 6 inch step 2x10; leg press 60 lbs 2x10     Ankle Exercises: Seated   Heel Raises 20 reps   Toe Raise 20 reps     Ankle Exercises: Aerobic   Stationary Bike L3 x 5 minutes    Tread Mill 15% incline 2.5 mph                PT Education - 09/21/15 2213    Education provided Yes   Education Details proper exercise progression    Person(s) Educated Patient   Methods Explanation;Demonstration   Comprehension Verbalized understanding;Returned demonstration          PT Short Term Goals -  09/19/15 1621      PT SHORT TERM GOAL #1   Title Patient will demsotrate 4+/5 gross left lower extremity pain    Time 4   Period Weeks   Status On-going     PT SHORT TERM GOAL #2   Title Patient will ambulate 300' in the clinic without the brace with proper gait pattern    Time 4   Period Weeks   Status Partially Met     PT SHORT TERM GOAL #3   Title Patient will be independent with intial HEP   Time 4   Period Weeks   Status Achieved     PT SHORT TERM GOAL #4   Title Patient will report 2/10 pain at worst    Time 4   Period Weeks   Status On-going     PT SHORT TERM GOAL #5   Title Patient will demsotrate a 30 second single leg stance time   Baseline 2 sec   Time 4   Period Weeks   Status On-going           PT Long Term Goals - 09/05/15 1259      PT LONG TERM GOAL #1   Title Patientwill demsotrate 20 degrees of active ankle dorsi-flexion in order to quat down to pick item off the ground.    Time 8   Period Weeks   Status New      PT LONG TERM GOAL #2   Title Patient will ambulate 1 mile without pain in order to ambulate around campus    Time 8   Period Weeks   Status New     PT LONG TERM GOAL #3   Title Patient will go up/down 2 flights of stairs with a reciprocol gait pattern and without pain in order to reach his appartment safely.    Time 8   Period Weeks   Status New               Plan - 09/21/15 2215    Clinical Impression Statement Patient is making great progress. He was able to begin functional training without pain. he has almost no antalgic gait without the boot. Patient reported some difficulty walking up hills at school. He was given low grade hills tredmill training to work on.    Rehab Potential Excellent   PT Frequency 2x / week   PT Duration 8 weeks   PT Treatment/Interventions ADLs/Self Care Home Management;Cryotherapy;Electrical Stimulation;Iontophoresis 75m/ml Dexamethasone;Functional mobility training;Stair training;Gait training;Moist Heat;Therapeutic activities;Therapeutic exercise;Balance training;Neuromuscular re-education;Patient/family education;Passive range of motion;Manual techniques;Dry needling;Splinting;Taping;Vasopneumatic Device;Visual/perceptual remediation/compensation   PT Next Visit Plan continue with functional training if tolerated well.    PT Home Exercise Plan standing weight shift, standing march, 4 way ankle,    Consulted and Agree with Plan of Care Patient      Patient will benefit from skilled therapeutic intervention in order to improve the following deficits and impairments:  Abnormal gait, Decreased range of motion, Difficulty walking, Decreased safety awareness, Pain, Increased muscle spasms, Decreased activity tolerance, Decreased strength, Decreased mobility, Decreased balance  Visit Diagnosis: Pain in left ankle and joints of left foot  Stiffness of left ankle, not elsewhere classified  Muscle weakness (generalized)  Difficulty in walking,  not elsewhere classified     Problem List Patient Active Problem List   Diagnosis Date Noted  . Rupture of left Achilles tendon 06/13/2015  . Achilles tendon rupture 06/13/2015  . Alopecia 01/03/2015    DCarney LivingPT DPT  09/22/2015, 7:40 AM  [place], [place], [address] Phone: [phone]   Fax:  [phone]  Name: Larry Haas MRN: [id number] Date of Birth: 04-12-1995

## 2015-09-26 ENCOUNTER — Ambulatory Visit: Payer: 59 | Attending: Orthopaedic Surgery | Admitting: Physical Therapy

## 2015-09-26 VITALS — BP 130/98

## 2015-09-26 DIAGNOSIS — R262 Difficulty in walking, not elsewhere classified: Secondary | ICD-10-CM | POA: Diagnosis present

## 2015-09-26 DIAGNOSIS — M25572 Pain in left ankle and joints of left foot: Secondary | ICD-10-CM | POA: Diagnosis not present

## 2015-09-26 DIAGNOSIS — M6281 Muscle weakness (generalized): Secondary | ICD-10-CM | POA: Diagnosis present

## 2015-09-26 DIAGNOSIS — M25672 Stiffness of left ankle, not elsewhere classified: Secondary | ICD-10-CM | POA: Diagnosis present

## 2015-09-26 NOTE — Therapy (Signed)
[place], [place], [address] Phone: [phone]   Fax:  [phone]  Physical Therapy Treatment  Patient Details  Name: Larry Haas MRN: [id number] Date of Birth: [birth year] Referring Provider: Dr Rodell Perna   Encounter Date: 09/26/2015      PT End of Session - 09/26/15 1708    Visit Number 4   Number of Visits 16   Date for PT Re-Evaluation 11/02/15   PT Start Time 6962   PT Stop Time 1500   PT Time Calculation (min) 43 min   Activity Tolerance Patient tolerated treatment well;No increased pain   Behavior During Therapy WFL for tasks assessed/performed      Past Medical History:  Diagnosis Date  . Achilles tendon rupture    left  . Allergy   . Complication of anesthesia    Stop breathing twice after having tonsils removed and started "refluxing"  . Headache   . Pneumonia     Past Surgical History:  Procedure Laterality Date  . ACHILLES TENDON SURGERY Left 06/13/2015   Procedure: ACHILLES TENDON REPAIR;  Surgeon: Marybelle Killings, MD;  Location: [place];  Service: Orthopedics;  Laterality: Left;  . APPENDECTOMY    . HERNIA REPAIR     At 61 months old   . TONSILLECTOMY      Vitals:   09/26/15 1533  BP: (!) 130/98        Subjective Assessment - 09/26/15 1426    Subjective No pain                         [facility] Adult PT Treatment/Exercise - 09/26/15 0001      Ambulation/Gait   Gait Comments cues for weight bearing,  using great toe to push off vs going through motions     Modalities   Modalities --  Declined     Manual Therapy   Manual therapy comments --     Ankle Exercises: Machines for Strengthening   Cybex Leg Press 75# x 15,  80 X 20     Ankle Exercises: Seated   Heel Raises 20 reps   Toe Raise 20 reps   Other Seated Ankle Exercises foot YOGA  great toe liift,  small toes lift, 4 points on floor sitting   Other Seated Ankle Exercises Bands 20 X IV,  EV.  min assist tibia     Ankle Exercises: Aerobic   Tread Mill 6 minutes level for warm up     Ankle Exercises: Standing   SLS 30 X2 with intermittant  fingers   Other Standing Ankle Exercises stand with equal weight shifts at base of great toe/ little toe,  unner/outer heel.    Other Standing Ankle Exercises 10 X 2 minisquats in pain free range.       Ankle Exercises: Supine   Other Supine Ankle Exercises --  verbal/ demo exercises for pool deep water exercises,walking     Ankle Exercises: Stretches   Other Stretch Great toe stretch, lifting heel,  needed assist to unflex toe                  PT Short Term Goals - 09/26/15 1710      PT SHORT TERM GOAL #1   Title Patient will demsotrate 4+/5 gross left lower extremity pain    Time 4   Period Weeks   Status Unable to assess     PT SHORT TERM  GOAL #2   Title Patient will ambulate 300' in the clinic without the brace with proper gait pattern    Baseline gait improves while he is warmed up post exercise.   Time 4   Period Weeks   Status Partially Met     PT SHORT TERM GOAL #3   Title Patient will be independent with intial HEP   Time 4   Status Achieved     PT SHORT TERM GOAL #4   Title Patient will report 2/10 pain at worst    Baseline no pain    Time 4   Period Weeks   Status Partially Met     PT SHORT TERM GOAL #5   Title Patient will demsotrate a 30 second single leg stance time   Baseline needs finger tips to last 30 seconds   Time 4   Period Weeks   Status On-going           PT Long Term Goals - 09/05/15 1259      PT LONG TERM GOAL #1   Title Patientwill demsotrate 20 degrees of active ankle dorsi-flexion in order to quat down to pick item off the ground.    Time 8   Period Weeks   Status New     PT LONG TERM GOAL #2   Title Patient will ambulate 1 mile without pain in order to ambulate around campus    Time 8   Period Weeks   Status New     PT LONG TERM GOAL #3   Title Patient  will go up/down 2 flights of stairs with a reciprocol gait pattern and without pain in order to reach his appartment safely.    Time 8   Period Weeks   Status New               Plan - 09/26/15 1709    Clinical Impression Statement Noted blister clear filled and blood filled others 1 X 1/2 inches medial heel from boot.  Skin intact and no signs of infection. Gait improves post exercises.   PT Next Visit Plan continue with functional training if tolerated well.    PT Home Exercise Plan continue,  may walk in water   Consulted and Agree with Plan of Care Patient      Patient will benefit from skilled therapeutic intervention in order to improve the following deficits and impairments:  Abnormal gait, Decreased range of motion, Difficulty walking, Decreased safety awareness, Pain, Increased muscle spasms, Decreased activity tolerance, Decreased strength, Decreased mobility, Decreased balance  Visit Diagnosis: Pain in left ankle and joints of left foot  Stiffness of left ankle, not elsewhere classified  Muscle weakness (generalized)  Difficulty in walking, not elsewhere classified     Problem List Patient Active Problem List   Diagnosis Date Noted  . Rupture of left Achilles tendon 06/13/2015  . Achilles tendon rupture 06/13/2015  . Alopecia 01/03/2015    Sherral Dirocco 09/26/2015, 5:14 PM  [facility] [address] [place], [place], [address] Phone: [phone]   Fax:  [phone]  Name: Larry Haas MRN: [id number] Date of Birth: 09/15/1995   Melvenia Needles, PTA 09/26/15 5:14 PM Phone: [phone] Fax: [phone]

## 2015-09-26 NOTE — Patient Instructions (Signed)
Verbal and demo exercises he could do in water.  Walking hamstring stretch,  Deep water

## 2015-09-28 ENCOUNTER — Ambulatory Visit: Payer: 59 | Admitting: Physical Therapy

## 2015-09-28 DIAGNOSIS — M25672 Stiffness of left ankle, not elsewhere classified: Secondary | ICD-10-CM

## 2015-09-28 DIAGNOSIS — M25572 Pain in left ankle and joints of left foot: Secondary | ICD-10-CM | POA: Diagnosis not present

## 2015-09-28 DIAGNOSIS — M6281 Muscle weakness (generalized): Secondary | ICD-10-CM

## 2015-09-28 DIAGNOSIS — R262 Difficulty in walking, not elsewhere classified: Secondary | ICD-10-CM

## 2015-09-28 NOTE — Therapy (Signed)
[facility] [address] [place], [place], [address] Phone: [phone]   Fax:  [phone]  Physical Therapy Treatment  Patient Details  Name: Larry Haas MRN: [id number] Date of Birth: 1995-10-02 Referring Provider: Dr Annell Greening   Encounter Date: 09/28/2015      PT End of Session - 09/28/15 1457    Visit Number 5   Number of Visits 16   Date for PT Re-Evaluation 11/02/15   PT Start Time 1418   PT Stop Time 1502   PT Time Calculation (min) 44 min   Activity Tolerance Patient tolerated treatment well   Behavior During Therapy [facility] for tasks assessed/performed      Past Medical History:  Diagnosis Date  . Achilles tendon rupture    left  . Allergy   . Complication of anesthesia    Stop breathing twice after having tonsils removed and started "refluxing"  . Headache   . Pneumonia     Past Surgical History:  Procedure Laterality Date  . ACHILLES TENDON SURGERY Left 06/13/2015   Procedure: ACHILLES TENDON REPAIR;  Surgeon: Eldred Manges, MD;  Location: [facility] OR;  Service: Orthopedics;  Laterality: Left;  . APPENDECTOMY    . HERNIA REPAIR     At 19 months old   . TONSILLECTOMY      There were no vitals filed for this visit.      Subjective Assessment - 09/28/15 1452    Subjective Patient continues to have very little pain. he reported no pain after last treatment. he has been doing his exercises and going to the gym.    Pertinent History nothing significant    Limitations Standing;Walking   How long can you sit comfortably? No limited    How long can you stand comfortably? < 1 hour    How long can you walk comfortably? limited community distances    Diagnostic tests Nothing post -op    Patient Stated Goals Back to normal life activity    Currently in Pain? No/denies   Pain Score 3    Pain Location Ankle   Pain Orientation Left   Pain Descriptors / Indicators Aching   Pain Frequency Intermittent   Aggravating Factors  standing/ walking    Pain Relieving Factors rest, ice    Effect of Pain on Daily Activities difficulty walking around campus    Multiple Pain Sites No                                 PT Education - 09/28/15 1455    Education provided Yes   Education Details proper exercise progression    Person(s) Educated Patient   Methods Explanation;Demonstration   Comprehension Verbalized understanding;Returned demonstration          PT Short Term Goals - 09/28/15 1520      PT SHORT TERM GOAL #1   Title Patient will demsotrate 4+/5 gross left lower extremity pain    Time 4   Period Weeks   Status On-going     PT SHORT TERM GOAL #2   Title Patient will ambulate 300' in the clinic without the brace with proper gait pattern    Baseline improved gait pattern    Time 4   Period Weeks   Status Achieved     PT SHORT TERM GOAL #3   Title Patient will be independent with intial HEP   Time 4  Period Weeks   Status Achieved     PT SHORT TERM GOAL #4   Title Patient will report 2/10 pain at worst    Baseline no pain    Time 4   Period Weeks   Status Achieved     PT SHORT TERM GOAL #5   Title Patient will demsotrate a 30 second single leg stance time   Baseline 20 seconds with finger tips    Time 4   Status On-going           PT Long Term Goals - 09/05/15 1259      PT LONG TERM GOAL #1   Title Patientwill demsotrate 20 degrees of active ankle dorsi-flexion in order to quat down to pick item off the ground.    Time 8   Period Weeks   Status New     PT LONG TERM GOAL #2   Title Patient will ambulate 1 mile without pain in order to ambulate around campus    Time 8   Period Weeks   Status New     PT LONG TERM GOAL #3   Title Patient will go up/down 2 flights of stairs with a reciprocol gait pattern and without pain in order to reach his appartment safely.    Time 8   Period Weeks   Status New               Plan -  09/28/15 1512    Clinical Impression Statement Patient continues to make good progress. He had no pain with functional strengthening. He had 10 degrees of DF on the left side but when compared to the right he also had 10 degrees.    Rehab Potential Excellent   PT Frequency 2x / week   PT Duration 8 weeks   PT Treatment/Interventions ADLs/Self Care Home Management;Cryotherapy;Electrical Stimulation;Iontophoresis 4mg /ml Dexamethasone;Functional mobility training;Stair training;Gait training;Moist Heat;Therapeutic activities;Therapeutic exercise;Balance training;Neuromuscular re-education;Patient/family education;Passive range of motion;Manual techniques;Dry needling;Splinting;Taping;Vasopneumatic Device;Visual/perceptual remediation/compensation   PT Next Visit Plan continue with functional training if tolerated well.    PT Home Exercise Plan continue,  may walk in water   Consulted and Agree with Plan of Care Patient      Patient will benefit from skilled therapeutic intervention in order to improve the following deficits and impairments:  Abnormal gait, Decreased range of motion, Difficulty walking, Decreased safety awareness, Pain, Increased muscle spasms, Decreased activity tolerance, Decreased strength, Decreased mobility, Decreased balance  Visit Diagnosis: Pain in left ankle and joints of left foot  Difficulty in walking, not elsewhere classified  Muscle weakness (generalized)  Stiffness of left ankle, not elsewhere classified     Problem List Patient Active Problem List   Diagnosis Date Noted  . Rupture of left Achilles tendon 06/13/2015  . Achilles tendon rupture 06/13/2015  . Alopecia 01/03/2015    [name] PT DPT  09/28/2015, 3:21 PM  [facility] [address] [place], [place], [address] Phone: [phone]   Fax:  [phone]  Name: Larry Haas MRN: [id number] Date of Birth: 05/19/1995

## 2015-10-03 ENCOUNTER — Ambulatory Visit: Payer: 59 | Admitting: Physical Therapy

## 2015-10-03 DIAGNOSIS — M6281 Muscle weakness (generalized): Secondary | ICD-10-CM

## 2015-10-03 DIAGNOSIS — R262 Difficulty in walking, not elsewhere classified: Secondary | ICD-10-CM

## 2015-10-03 DIAGNOSIS — M25672 Stiffness of left ankle, not elsewhere classified: Secondary | ICD-10-CM

## 2015-10-03 DIAGNOSIS — M25572 Pain in left ankle and joints of left foot: Secondary | ICD-10-CM

## 2015-10-04 NOTE — Therapy (Signed)
[facility] [address] [place], [place], [address] Phone: [phone]   Fax:  [phone]  Physical Therapy Treatment  Patient Details  Name: Larry Haas MRN: [id number] Date of Birth: [birth year] Referring Provider: Dr Annell Greening   Encounter Date: 10/03/2015      PT End of Session - 10/03/15 1549    Visit Number 6   Number of Visits 16   Date for PT Re-Evaluation 11/02/15   PT Start Time 0217   PT Stop Time 0310   PT Time Calculation (min) 53 min      Past Medical History:  Diagnosis Date  . Achilles tendon rupture    left  . Allergy   . Complication of anesthesia    Stop breathing twice after having tonsils removed and started "refluxing"  . Headache   . Pneumonia     Past Surgical History:  Procedure Laterality Date  . ACHILLES TENDON SURGERY Left 06/13/2015   Procedure: ACHILLES TENDON REPAIR;  Surgeon: Eldred Manges, MD;  Location: [facility] OR;  Service: Orthopedics;  Laterality: Left;  . APPENDECTOMY    . HERNIA REPAIR     At 5 months old   . TONSILLECTOMY      There were no vitals filed for this visit.      Subjective Assessment - 10/03/15 1423    Subjective I took a wrong step and rolled my ankle saturday night at a party.    Currently in Pain? No/denies                         [facility] Adult PT Treatment/Exercise - 10/04/15 0001      Ankle Exercises: Standing   SLS 40 seconds without UE support  again on foam pad 10 sec best   Rocker Board 2 minutes   Rebounder 10 toss best after 8 attempts red ball   Heel Raises 20 reps  box to encourage eccentric lowering   Toe Raise 20 reps   Other Standing Ankle Exercises Full squats x 10    Other Standing Ankle Exercises tandem stance with left foot back 30 sec x 2      Ankle Exercises: Aerobic   Elliptical Ramp 10 resist 12     Ankle Exercises: Machines for Strengthening   Cybex Leg Press 120# x20 bilatera, single leg 60# x 10 , heel  raises x10 20#, 60# bilateral toe press, attempted single at 20# too difficult                  PT Short Term Goals - 09/28/15 1520      PT SHORT TERM GOAL #1   Title Patient will demsotrate 4+/5 gross left lower extremity pain    Time 4   Period Weeks   Status On-going     PT SHORT TERM GOAL #2   Title Patient will ambulate 300' in the clinic without the brace with proper gait pattern    Baseline improved gait pattern    Time 4   Period Weeks   Status Achieved     PT SHORT TERM GOAL #3   Title Patient will be independent with intial HEP   Time 4   Period Weeks   Status Achieved     PT SHORT TERM GOAL #4   Title Patient will report 2/10 pain at worst    Baseline no pain    Time 4   Period Weeks   Status  Achieved     PT SHORT TERM GOAL #5   Title Patient will demsotrate a 30 second single leg stance time   Baseline 20 seconds with finger tips    Time 4   Status On-going           PT Long Term Goals - 09/05/15 1259      PT LONG TERM GOAL #1   Title Patientwill demsotrate 20 degrees of active ankle dorsi-flexion in order to quat down to pick item off the ground.    Time 8   Period Weeks   Status New     PT LONG TERM GOAL #2   Title Patient will ambulate 1 mile without pain in order to ambulate around campus    Time 8   Period Weeks   Status New     PT LONG TERM GOAL #3   Title Patient will go up/down 2 flights of stairs with a reciprocol gait pattern and without pain in order to reach his appartment safely.    Time 8   Period Weeks   Status New               Plan - 10/03/15 1529    Clinical Impression Statement improved SLS to 40 sec. Able to begin rebounder toss with SLS. Able to perform full squats. Heel raises remain difficult. We tried concentric raises with eccentric left lowering. He has difficulty with this. one episode of increased pain after attempting toe press with single leg. ice post for soreness.    PT Next Visit Plan  continue with functional training as tolerated, SLS, PF strength as tolerated      Patient will benefit from skilled therapeutic intervention in order to improve the following deficits and impairments:  Abnormal gait, Decreased range of motion, Difficulty walking, Decreased safety awareness, Pain, Increased muscle spasms, Decreased activity tolerance, Decreased strength, Decreased mobility, Decreased balance  Visit Diagnosis: Pain in left ankle and joints of left foot  Difficulty in walking, not elsewhere classified  Muscle weakness (generalized)  Stiffness of left ankle, not elsewhere classified     Problem List Patient Active Problem List   Diagnosis Date Noted  . Rupture of left Achilles tendon 06/13/2015  . Achilles tendon rupture 06/13/2015  . Alopecia 01/03/2015    Sherrie Mustacheonoho, Audreyanna Butkiewicz McGee 10/04/2015, 9:57 AM  [facility] [address] [place], [place], [address] Phone: [phone]   Fax:  [phone]  Name: Larry Haas MRN: [id number] Date of Birth: 12/19/1995

## 2015-10-05 ENCOUNTER — Ambulatory Visit: Payer: 59 | Admitting: Physical Therapy

## 2015-10-10 ENCOUNTER — Ambulatory Visit: Payer: 59 | Admitting: Physical Therapy

## 2015-10-10 DIAGNOSIS — M25672 Stiffness of left ankle, not elsewhere classified: Secondary | ICD-10-CM

## 2015-10-10 DIAGNOSIS — M6281 Muscle weakness (generalized): Secondary | ICD-10-CM

## 2015-10-10 DIAGNOSIS — R262 Difficulty in walking, not elsewhere classified: Secondary | ICD-10-CM

## 2015-10-10 DIAGNOSIS — M25572 Pain in left ankle and joints of left foot: Secondary | ICD-10-CM | POA: Diagnosis not present

## 2015-10-10 NOTE — Therapy (Signed)
[place], [place], [address] Phone: [phone]   Fax:  [phone]  Physical Therapy Treatment  Patient Details  Name: Larry Haas MRN: [id number] Date of Birth: [birth year] Referring Provider: Dr Rodell Perna   Encounter Date: 10/10/2015      PT End of Session - 10/10/15 1420    Visit Number 7   Number of Visits 16   Date for PT Re-Evaluation 11/02/15   PT Start Time 0217   PT Stop Time 0300   PT Time Calculation (min) 43 min      Past Medical History:  Diagnosis Date  . Achilles tendon rupture    left  . Allergy   . Complication of anesthesia    Stop breathing twice after having tonsils removed and started "refluxing"  . Headache   . Pneumonia     Past Surgical History:  Procedure Laterality Date  . ACHILLES TENDON SURGERY Left 06/13/2015   Procedure: ACHILLES TENDON REPAIR;  Surgeon: Marybelle Killings, MD;  Location: [place];  Service: Orthopedics;  Laterality: Left;  . APPENDECTOMY    . HERNIA REPAIR     At 27 months old   . TONSILLECTOMY      There were no vitals filed for this visit.      Subjective Assessment - 10/10/15 1420    Subjective I ran 10 feet and started limping, no pain.    Currently in Pain? No/denies            [facility] PT Assessment - 10/10/15 0001      PROM   Left Ankle Dorsiflexion --  during squat                     [facility] Adult PT Treatment/Exercise - 10/10/15 0001      Ambulation/Gait   Ambulation/Gait Yes   Stairs Yes   Stair Management Technique No rails   Number of Stairs 16   Height of Stairs 6   Gait Comments able to ascend and descend stairs, no rails, no pain.     Knee/Hip Exercises: Standing   Step Down 20 reps;Hand Hold: 1;Step Height: 4"   Step Down Limitations poor quad control     Ankle Exercises: Standing   SLS 60 second best   on foam 27 sec best   Rebounder 8 toss best  with SLS , then 11 toss red ball   on foam  disc 6,6,10 toss best  red ball    Heel Raises 20 reps  box to encourage eccentric lowering, then weight shift LT    Toe Raise 20 reps   Other Standing Ankle Exercises Full squats x 10 with weight shift left      Ankle Exercises: Stretches   Gastroc Stretch 60 seconds   Slant Board Stretch 60 seconds     Ankle Exercises: Aerobic   Elliptical Ramp 10 resist 12 x 5 minutes     Ankle Exercises: Machines for Strengthening   Cybex Leg Press 20# bilateral toe press, attempts at single heel lowers.                   PT Short Term Goals - 10/10/15 1421      PT SHORT TERM GOAL #1   Title Patient will demsotrate 4+/5 gross left lower extremity pain    Time 4   Period Weeks   Status On-going     PT SHORT TERM GOAL #2  Title Patient will ambulate 300' in the clinic without the brace with proper gait pattern    Status Achieved     PT SHORT TERM GOAL #3   Title Patient will be independent with intial HEP   Status Achieved     PT SHORT TERM GOAL #4   Title Patient will report 2/10 pain at worst    Status Achieved     PT SHORT TERM GOAL #5   Title Patient will demsotrate a 30 second single leg stance time   Status Achieved           PT Long Term Goals - 10/10/15 1423      PT LONG TERM GOAL #1   Title Patientwill demsotrate 20 degrees of active ankle dorsi-flexion in order to quat down to pick item off the ground.    Time 8   Period Weeks   Status Unable to assess     PT LONG TERM GOAL #2   Title Patient will ambulate 1 mile without pain in order to ambulate around campus    Time 8   Period Weeks   Status Achieved     PT LONG TERM GOAL #3   Title Patient will go up/down 2 flights of stairs with a reciprocol gait pattern and without pain in order to reach his appartment safely.    Time 8   Period Weeks   Status Achieved               Plan - 10/10/15 1422    Clinical Impression Statement All StGs Met. Pt has been working on heel raises. He is still  unable to perform single heel raise on left. He demostrates poor quad control with retro step ups and downs however can ascend and descend stairs without rail and no increased pain. LTG#2, #3 Met. He is ambulating around campus approximately 1 mile without pain.    PT Next Visit Plan continue with functional training as tolerated, SLS, PF strength as tolerated      Patient will benefit from skilled therapeutic intervention in order to improve the following deficits and impairments:  Abnormal gait, Decreased range of motion, Difficulty walking, Decreased safety awareness, Pain, Increased muscle spasms, Decreased activity tolerance, Decreased strength, Decreased mobility, Decreased balance  Visit Diagnosis: Pain in left ankle and joints of left foot  Difficulty in walking, not elsewhere classified  Muscle weakness (generalized)  Stiffness of left ankle, not elsewhere classified     Problem List Patient Active Problem List   Diagnosis Date Noted  . Rupture of left Achilles tendon 06/13/2015  . Achilles tendon rupture 06/13/2015  . Alopecia 01/03/2015    Dorene Ar, PTA 10/10/2015, 3:53 PM  [facility] [address] [place], [place], [address] Phone: [phone]   Fax:  [phone]  Name: Larry Haas MRN: [id number] Date of Birth: 10/26/1995

## 2015-10-17 ENCOUNTER — Ambulatory Visit: Payer: 59 | Admitting: Physical Therapy

## 2015-10-17 DIAGNOSIS — M6281 Muscle weakness (generalized): Secondary | ICD-10-CM

## 2015-10-17 DIAGNOSIS — R262 Difficulty in walking, not elsewhere classified: Secondary | ICD-10-CM

## 2015-10-17 DIAGNOSIS — M25572 Pain in left ankle and joints of left foot: Secondary | ICD-10-CM

## 2015-10-17 DIAGNOSIS — M25672 Stiffness of left ankle, not elsewhere classified: Secondary | ICD-10-CM

## 2015-10-18 NOTE — Therapy (Signed)
[place], [place], [address] Phone: [phone]   Fax:  [phone]  Physical Therapy Evaluation  Patient Details  Name: Larry Haas MRN: [id number] Date of Birth: [birth year] Referring Provider: Dr Rodell Perna   Encounter Date: 10/17/2015      PT End of Session - 10/17/15 1620    Visit Number 8   Number of Visits 16   Date for PT Re-Evaluation 11/02/15   PT Start Time 0712   PT Stop Time 1458   PT Time Calculation (min) 43 min   Activity Tolerance Patient tolerated treatment well   Behavior During Therapy [facility] for tasks assessed/performed      Past Medical History:  Diagnosis Date  . Achilles tendon rupture    left  . Allergy   . Complication of anesthesia    Stop breathing twice after having tonsils removed and started "refluxing"  . Headache   . Pneumonia     Past Surgical History:  Procedure Laterality Date  . ACHILLES TENDON SURGERY Left 06/13/2015   Procedure: ACHILLES TENDON REPAIR;  Surgeon: Marybelle Killings, MD;  Location: [place];  Service: Orthopedics;  Laterality: Left;  . APPENDECTOMY    . HERNIA REPAIR     At 20 months old   . TONSILLECTOMY      There were no vitals filed for this visit.       Subjective Assessment - 10/17/15 1540    Subjective Patient started lifting legs yesterday. he is sore but mostly in his muscles. he had a slight amount of achillies soreness but mostly just a tight feeling. He has noit attmeptemted running today. The patien has been released byt the MD.     Pertinent History nothing significant    Limitations Standing;Walking   How long can you sit comfortably? No limited    How long can you stand comfortably? < 1 hour    How long can you walk comfortably? limited community distances    Diagnostic tests Nothing post -op    Patient Stated Goals Back to normal life activity    Currently in Pain? No/denies   Pain Score 2    Pain Descriptors /  Indicators Aching   Pain Onset More than a month ago   Pain Frequency Intermittent   Multiple Pain Sites No                       [facility] Adult PT Treatment/Exercise - 10/18/15 0001      Ambulation/Gait   Gait Comments tremill gait analyisis in slow motion: Poor left foot push off: outside side shuffle hesitancy on L 4x30' heel walk 2x30' Can not perfrom toe walk;      Knee/Hip Exercises: Machines for Strengthening   Other Machine heel raise with right foot graded assist 2x10      Knee/Hip Exercises: Standing   Other Standing Knee Exercises conse tocuhes to stoll 2x10; standing yellow medicine ball toss 2x10 single leg stance;      Ankle Exercises: Standing   Other Standing Ankle Exercises Standing with right leg back for stability 2x10 heel raise leg straight 2x10 knee bent      Ankle Exercises: Seated   Toe Raise 20 reps                PT Education - 10/17/15 1620    Education provided Yes   Education Details improtance of increasing ability to do a single leg heel  raise.    Person(s) Educated Patient   Methods Explanation;Demonstration   Comprehension Verbalized understanding;Returned demonstration          PT Short Term Goals - 10/18/15 0754      PT SHORT TERM GOAL #1   Title Patient will demsotrate 4+/5 gross left lower extremity pain    Baseline 2/5 PF strength 4+/5 hip abductor all other 5/5    Time 4   Period Weeks   Status Partially Met     PT SHORT TERM GOAL #2   Title Patient will ambulate 300' in the clinic without the brace with proper gait pattern    Baseline improved gait pattern    Time 4   Period Weeks   Status Achieved     PT SHORT TERM GOAL #3   Title Patient will be independent with intial HEP   Time 4   Period Weeks   Status Achieved     PT SHORT TERM GOAL #4   Title Patient will report 2/10 pain at worst    Baseline no pain    Time 4   Period Weeks   Status Achieved     PT SHORT TERM GOAL #5   Title Patient  will demsotrate a 30 second single leg stance time   Baseline 30 seconds achieved moved to higher level activity    Time 4   Period Weeks   Status Achieved           PT Long Term Goals - 10/10/15 1423      PT LONG TERM GOAL #1   Title Patientwill demsotrate 20 degrees of active ankle dorsi-flexion in order to quat down to pick item off the ground.    Time 8   Period Weeks   Status Unable to assess     PT LONG TERM GOAL #2   Title Patient will ambulate 1 mile without pain in order to ambulate around campus    Time 8   Period Weeks   Status Achieved     PT LONG TERM GOAL #3   Title Patient will go up/down 2 flights of stairs with a reciprocol gait pattern and without pain in order to reach his appartment safely.    Time 8   Period Weeks   Status Achieved               Plan - 10/18/15 0752    Clinical Impression Statement Patient continues to have significant weakness with his single leg heel raise that is likley effecting his baility to run. He was given 3 exercises to focus on over the next few weeks. He was advised to to run until he can perfrom a single leg heel raise. he was also advised that these exercises would likely make him sore and educated him on  symptom management including RICE and activity modification.    Rehab Potential Excellent   PT Frequency 2x / week   PT Duration 8 weeks   PT Treatment/Interventions ADLs/Self Care Home Management;Cryotherapy;Electrical Stimulation;Iontophoresis 35m/ml Dexamethasone;Functional mobility training;Stair training;Gait training;Moist Heat;Therapeutic activities;Therapeutic exercise;Balance training;Neuromuscular re-education;Patient/family education;Passive range of motion;Manual techniques;Dry needling;Splinting;Taping;Vasopneumatic Device;Visual/perceptual remediation/compensation   PT Next Visit Plan continue with functional training as tolerated, SLS, PF strength as tolerated   PT Home Exercise Plan continue,  may  walk in water   Consulted and Agree with Plan of Care Patient      Patient will benefit from skilled therapeutic intervention in order to improve the following deficits and impairments:  Abnormal  gait, Decreased range of motion, Difficulty walking, Decreased safety awareness, Pain, Increased muscle spasms, Decreased activity tolerance, Decreased strength, Decreased mobility, Decreased balance  Visit Diagnosis: Pain in left ankle and joints of left foot  Difficulty in walking, not elsewhere classified  Muscle weakness (generalized)  Stiffness of left ankle, not elsewhere classified     Problem List Patient Active Problem List   Diagnosis Date Noted  . Rupture of left Achilles tendon 06/13/2015  . Achilles tendon rupture 06/13/2015  . Alopecia 01/03/2015    Carney Living PT DPT  10/18/2015, 7:57 AM  [facility] [address] [place], [place], [address] Phone: [phone]   Fax:  [phone]  Name: Larry Haas MRN: [id number] Date of Birth: 20-Jun-1995

## 2015-10-19 ENCOUNTER — Ambulatory Visit: Payer: 59 | Admitting: Physical Therapy

## 2015-10-19 DIAGNOSIS — M25672 Stiffness of left ankle, not elsewhere classified: Secondary | ICD-10-CM

## 2015-10-19 DIAGNOSIS — M6281 Muscle weakness (generalized): Secondary | ICD-10-CM

## 2015-10-19 DIAGNOSIS — M25572 Pain in left ankle and joints of left foot: Secondary | ICD-10-CM

## 2015-10-19 DIAGNOSIS — R262 Difficulty in walking, not elsewhere classified: Secondary | ICD-10-CM

## 2015-10-20 NOTE — Therapy (Signed)
[name], [place], [address] Phone: [phone]   Fax:  [phone]  Physical Therapy Treatment  Patient Details  Name: Larry Haas MRN: [id number] Date of Birth: 04/28/1995 Referring Provider: Dr Rodell Perna   Encounter Date: 10/19/2015      PT End of Session - 10/20/15 0749    Visit Number 9   Number of Visits 16   Date for PT Re-Evaluation 11/02/15   PT Start Time 9292   PT Stop Time 1458   PT Time Calculation (min) 43 min   Activity Tolerance Patient tolerated treatment well   Behavior During Therapy [facility] for tasks assessed/performed      Past Medical History:  Diagnosis Date  . Achilles tendon rupture    left  . Allergy   . Complication of anesthesia    Stop breathing twice after having tonsils removed and started "refluxing"  . Headache   . Pneumonia     Past Surgical History:  Procedure Laterality Date  . ACHILLES TENDON SURGERY Left 06/13/2015   Procedure: ACHILLES TENDON REPAIR;  Surgeon: Marybelle Killings, MD;  Location: [place];  Service: Orthopedics;  Laterality: Left;  . APPENDECTOMY    . HERNIA REPAIR     At 43 months old   . TONSILLECTOMY      There were no vitals filed for this visit.      Subjective Assessment - 10/20/15 0747    Subjective No pain after the last visit. He has been doing his exercises daily. he has no pain today.    Pertinent History nothing significant    Limitations Standing;Walking   How long can you stand comfortably? < 1 hour    How long can you walk comfortably? limited community distances    Diagnostic tests Nothing post -op    Patient Stated Goals Back to normal life activity    Currently in Pain? No/denies                         [facility] Adult PT Treatment/Exercise - 10/20/15 0001      Ambulation/Gait   Gait Comments tremill gait analyisis in slow motion: Poor left foot push off: outside side shuffle hesitancy on L 4x30' heel  walk 2x30' Can not perfrom toe walk;      Knee/Hip Exercises: Machines for Strengthening   Other Machine heel raise with right foot graded assist 2x10      Knee/Hip Exercises: Standing   Other Standing Knee Exercises conse tocuhes to stoll 2x10; standing yellow medicine ball toss 2x10 single leg stance;      Ankle Exercises: Standing   Other Standing Ankle Exercises Standing with right leg back for stability 2x10 heel raise leg straight 2x10 knee bent; cable walk forward / back 13.5 KG       Ankle Exercises: Seated   Toe Raise 20 reps     Ankle Exercises: Machines for Strengthening   Cybex Leg Press 20# bilateral toe press, attempts at single heel lowers.                 PT Education - 10/20/15 0748    Education provided Yes   Education Details continue to strengthen within pain free range    Person(s) Educated Patient   Methods Explanation;Demonstration   Comprehension Verbalized understanding;Returned demonstration          PT Short Term Goals - 10/20/15 0753      PT  SHORT TERM GOAL #1   Title Patient will demsotrate 4+/5 gross left lower extremity pain    Baseline 2/5 PF strength 4+/5 hip abductor all other 5/5    Time 4   Period Weeks   Status Partially Met     PT SHORT TERM GOAL #2   Title Patient will ambulate 300' in the clinic without the brace with proper gait pattern    Baseline improved gait pattern    Time 4   Period Weeks   Status Achieved     PT SHORT TERM GOAL #3   Title Patient will be independent with intial HEP   Time 4   Period Weeks   Status Achieved     PT SHORT TERM GOAL #4   Title Patient will report 2/10 pain at worst    Baseline no pain    Time 4   Period Weeks   Status Achieved     PT SHORT TERM GOAL #5   Title Patient will demsotrate a 30 second single leg stance time   Baseline 30 seconds achieved moved to higher level activity    Time 4   Period Weeks   Status Achieved           PT Long Term Goals - 10/10/15  1423      PT LONG TERM GOAL #1   Title Patientwill demsotrate 20 degrees of active ankle dorsi-flexion in order to quat down to pick item off the ground.    Time 8   Period Weeks   Status Unable to assess     PT LONG TERM GOAL #2   Title Patient will ambulate 1 mile without pain in order to ambulate around campus    Time 8   Period Weeks   Status Achieved     PT LONG TERM GOAL #3   Title Patient will go up/down 2 flights of stairs with a reciprocol gait pattern and without pain in order to reach his appartment safely.    Time 8   Period Weeks   Status Achieved               Plan - 10/20/15 0752    Clinical Impression Statement Patient was able to perfrom Single leg heel raise with 20 lb on the leg press. He is making great progress at this time. He continues to haveweakness but it seems to be improving.    Rehab Potential Excellent   PT Frequency 2x / week   PT Duration 8 weeks   PT Treatment/Interventions ADLs/Self Care Home Management;Cryotherapy;Electrical Stimulation;Iontophoresis 16m/ml Dexamethasone;Functional mobility training;Stair training;Gait training;Moist Heat;Therapeutic activities;Therapeutic exercise;Balance training;Neuromuscular re-education;Patient/family education;Passive range of motion;Manual techniques;Dry needling;Splinting;Taping;Vasopneumatic Device;Visual/perceptual remediation/compensation   PT Next Visit Plan continue with functional training as tolerated, SLS, PF strength as tolerated   PT Home Exercise Plan continue,  may walk in water   Consulted and Agree with Plan of Care Patient      Patient will benefit from skilled therapeutic intervention in order to improve the following deficits and impairments:  Abnormal gait, Decreased range of motion, Difficulty walking, Decreased safety awareness, Pain, Increased muscle spasms, Decreased activity tolerance, Decreased strength, Decreased mobility, Decreased balance  Visit Diagnosis: Pain in left  ankle and joints of left foot  Difficulty in walking, not elsewhere classified  Muscle weakness (generalized)  Stiffness of left ankle, not elsewhere classified     Problem List Patient Active Problem List   Diagnosis Date Noted  . Rupture of left Achilles tendon 06/13/2015  .  Achilles tendon rupture 06/13/2015  . Alopecia 01/03/2015    Carney Living PT DPT  10/20/2015, 7:55 AM  [facility] [address] [place], [place], [address] Phone: [phone]   Fax:  [phone]  Name: Larry Haas MRN: [id number] Date of Birth: 1995-09-14

## 2015-10-24 ENCOUNTER — Ambulatory Visit: Payer: 59 | Attending: Orthopaedic Surgery | Admitting: Physical Therapy

## 2015-10-24 DIAGNOSIS — M25672 Stiffness of left ankle, not elsewhere classified: Secondary | ICD-10-CM | POA: Insufficient documentation

## 2015-10-24 DIAGNOSIS — M6281 Muscle weakness (generalized): Secondary | ICD-10-CM | POA: Insufficient documentation

## 2015-10-24 DIAGNOSIS — M25572 Pain in left ankle and joints of left foot: Secondary | ICD-10-CM | POA: Diagnosis not present

## 2015-10-24 DIAGNOSIS — R262 Difficulty in walking, not elsewhere classified: Secondary | ICD-10-CM | POA: Diagnosis present

## 2015-10-24 NOTE — Therapy (Signed)
[facility] [address] [place], [place], [address] Phone: [phone]   Fax:  [phone]  Physical Therapy Treatment  Patient Details  Name: Larry Haas Date of Birth: 10/30/1995 Referring Provider: Dr Annell Greening   Encounter Date: 10/24/2015      PT End of Session - 10/24/15 1459    Visit Number 10   Number of Visits 16   Date for PT Re-Evaluation 11/02/15   PT Start Time 1410   PT Stop Time 1448   PT Time Calculation (min) 38 min   Activity Tolerance Patient tolerated treatment well   Behavior During Therapy [facility] for tasks assessed/performed      Past Medical History:  Diagnosis Date  . Achilles tendon rupture    left  . Allergy   . Complication of anesthesia    Stop breathing twice after having tonsils removed and started "refluxing"  . Headache   . Pneumonia     Past Surgical History:  Procedure Laterality Date  . ACHILLES TENDON SURGERY Left 06/13/2015   Procedure: ACHILLES TENDON REPAIR;  Surgeon: Eldred Manges, MD;  Location: [facility] OR;  Service: Orthopedics;  Laterality: Left;  . APPENDECTOMY    . HERNIA REPAIR     At 65 months old   . TONSILLECTOMY      There were no vitals filed for this visit.      Subjective Assessment - 10/24/15 1411    Subjective Patient reports he has been a little sore. He worked on his legs yesterday. He reports he is also a little light headed from getting blood drawn. he wishes to continue with the session.    Pertinent History nothing significant    Limitations Standing;Walking   How long can you sit comfortably? No limited    How long can you stand comfortably? < 1 hour    How long can you walk comfortably? limited community distances    Diagnostic tests Nothing post -op    Patient Stated Goals Back to normal life activity    Currently in Pain? No/denies  Just minor soreness in his calf                          [facility] Adult PT  Treatment/Exercise - 10/24/15 0001      Knee/Hip Exercises: Machines for Strengthening   Cybex Leg Press 3x10 eccentric 160    Other Machine heel raise with left foot  assist 2x10 20 lb      Knee/Hip Exercises: Standing   Other Standing Knee Exercises cone tocuhes to stoll 2x10; standing yellow medicine ball toss 2x10 single leg stance;      Ankle Exercises: Stretches   Slant Board Stretch 60 seconds     Ankle Exercises: Standing   Other Standing Ankle Exercises Standing with right leg back for stability 2x10 heel raise leg straight 2x10 knee bent; cable walk forward / back 13.5 KG     Other Standing Ankle Exercises Rocker board fwd/ back x20; sid eto side 20x                 PT Education - 10/24/15 1459    Education Details continue to work on single leg heel raise    Methods Demonstration;Explanation   Comprehension Returned demonstration;Verbalized understanding          PT Short Term Goals - 10/24/15 1502      PT SHORT TERM GOAL #1  Title Patient will demsotrate 4+/5 gross left lower extremity pain    Baseline 2/5 PF strength 4+/5 hip abductor all other 5/5    Time 4   Period Weeks     PT SHORT TERM GOAL #2   Title Patient will ambulate 300' in the clinic without the brace with proper gait pattern    Baseline improved gait pattern    Time 4   Period Weeks   Status Achieved     PT SHORT TERM GOAL #3   Title Patient will be independent with intial HEP   Time 4   Period Weeks   Status Achieved     PT SHORT TERM GOAL #4   Title Patient will report 2/10 pain at worst    Baseline no pain    Time 4   Period Weeks   Status Achieved     PT SHORT TERM GOAL #5   Title Patient will demsotrate a 30 second single leg stance time   Baseline 30 seconds achieved moved to higher level activity    Time 4   Period Weeks   Status Achieved           PT Long Term Goals - 10/10/15 1423      PT LONG TERM GOAL #1   Title Patientwill demsotrate 20 degrees of  active ankle dorsi-flexion in order to quat down to pick item off the ground.    Time 8   Period Weeks   Status Unable to assess     PT LONG TERM GOAL #2   Title Patient will ambulate 1 mile without pain in order to ambulate around campus    Time 8   Period Weeks   Status Achieved     PT LONG TERM GOAL #3   Title Patient will go up/down 2 flights of stairs with a reciprocol gait pattern and without pain in order to reach his appartment safely.    Time 8   Period Weeks   Status Achieved               Plan - 10/24/15 1500    Clinical Impression Statement Patient was fatigued from prior workout today. Overall he is doing well. He is still having some difficulty with single leg heel raise. He was encouraged to continue practicing at home    Rehab Potential Excellent   PT Frequency 2x / week   PT Duration 8 weeks   PT Treatment/Interventions ADLs/Self Care Home Management;Cryotherapy;Electrical Stimulation;Iontophoresis 4mg /ml Dexamethasone;Functional mobility training;Stair training;Gait training;Moist Heat;Therapeutic activities;Therapeutic exercise;Balance training;Neuromuscular re-education;Patient/family education;Passive range of motion;Manual techniques;Dry needling;Splinting;Taping;Vasopneumatic Device;Visual/perceptual remediation/compensation   PT Next Visit Plan continue with functional training as tolerated, SLS, PF strength as tolerated   PT Home Exercise Plan continue,  may walk in water   Consulted and Agree with Plan of Care Patient      Patient will benefit from skilled therapeutic intervention in order to improve the following deficits and impairments:  Abnormal gait, Decreased range of motion, Difficulty walking, Decreased safety awareness, Pain, Increased muscle spasms, Decreased activity tolerance, Decreased strength, Decreased mobility, Decreased balance  Visit Diagnosis: Pain in left ankle and joints of left foot  Difficulty in walking, not elsewhere  classified  Muscle weakness (generalized)  Stiffness of left ankle, not elsewhere classified     Problem List Patient Active Problem List   Diagnosis Date Noted  . Rupture of left Achilles tendon 06/13/2015  . Achilles tendon rupture 06/13/2015  . Alopecia 01/03/2015    Onalee Hua  Marquita PalmsJ Juanda Luba PT DPT  10/24/2015, 3:11 PM  [facility] [address] [place], [place], [address] Phone: [phone]   Fax:  [phone]  Name: Larry Haas MRN: [id number] Date of Birth: 05/15/1995

## 2015-10-26 ENCOUNTER — Ambulatory Visit: Payer: 59 | Admitting: Physical Therapy

## 2015-10-26 DIAGNOSIS — R262 Difficulty in walking, not elsewhere classified: Secondary | ICD-10-CM

## 2015-10-26 DIAGNOSIS — M25572 Pain in left ankle and joints of left foot: Secondary | ICD-10-CM | POA: Diagnosis not present

## 2015-10-26 DIAGNOSIS — M6281 Muscle weakness (generalized): Secondary | ICD-10-CM

## 2015-10-26 DIAGNOSIS — M25672 Stiffness of left ankle, not elsewhere classified: Secondary | ICD-10-CM

## 2015-10-27 NOTE — Therapy (Signed)
[facility] [address] [place], [place], [address] Phone: [phone]   Fax:  [phone]  Physical Therapy Treatment  Patient Details  Name: Larry Haas MRN: [id number] Date of Birth: 10/19/1995 Referring Provider: Dr Annell GreeningMark Yates   Encounter Date: 10/26/2015      PT End of Session - 10/27/15 0758    Visit Number 11   Number of Visits 16   Date for PT Re-Evaluation 11/02/15   PT Start Time 1410   PT Stop Time 1455   PT Time Calculation (min) 45 min   Activity Tolerance Treatment limited secondary to medical complications (Comment)   Behavior During Therapy [facility] for tasks assessed/performed      Past Medical History:  Diagnosis Date  . Achilles tendon rupture    left  . Allergy   . Complication of anesthesia    Stop breathing twice after having tonsils removed and started "refluxing"  . Headache   . Pneumonia     Past Surgical History:  Procedure Laterality Date  . ACHILLES TENDON SURGERY Left 06/13/2015   Procedure: ACHILLES TENDON REPAIR;  Surgeon: Eldred MangesMark C Yates, MD;  [facility];  Service: Orthopedics;  Laterality: Left;  . APPENDECTOMY    . HERNIA REPAIR     At 36 months old   . TONSILLECTOMY      There were no vitals filed for this visit.      Subjective Assessment - 10/27/15 0757    Subjective Patient has no complaints at this time. He has been working hard on his single leg heel raise.    Pertinent History nothing significant    Limitations Standing;Walking   How long can you sit comfortably? No limited    How long can you stand comfortably? < 1 hour    How long can you walk comfortably? limited community distances    Diagnostic tests Nothing post -op    Patient Stated Goals Back to normal life activity    Currently in Pain? No/denies                         [facility] Adult PT Treatment/Exercise - 10/27/15 0001      Knee/Hip Exercises: Machines for Strengthening   Cybex Leg  Press 3x10 eccentric 160    Other Machine heel raise with left foot  assist 3x10 40 lb      Knee/Hip Exercises: Standing   Other Standing Knee Exercises cone touches to floor 2x10; standing yellow medicine ball toss 2x10 single leg stance; Wall punches 2x10 Double punches x10 right x10 left; side shuffles, low skips 30'x4 light jog 30'x4 some tightness still noted but improved.      Ankle Exercises: Stretches   Slant Board Stretch 60 seconds     Ankle Exercises: Standing   Other Standing Ankle Exercises Standing with right leg back for stability 2x10 heel raise leg straight 2x10 knee bent; cable walk forward / back 13.5 KG     Other Standing Ankle Exercises Rocker board fwd/ back x20; sid eto side 20x                 PT Education - 10/27/15 0758    Education provided Yes   Education Details reviewed light return to running activity    Person(s) Educated Patient   Methods Explanation;Demonstration   Comprehension Verbalized understanding;Returned demonstration          PT Short Term Goals - 10/24/15 1502  PT SHORT TERM GOAL #1   Title Patient will demsotrate 4+/5 gross left lower extremity pain    Baseline 2/5 PF strength 4+/5 hip abductor all other 5/5    Time 4   Period Weeks     PT SHORT TERM GOAL #2   Title Patient will ambulate 300' in the clinic without the brace with proper gait pattern    Baseline improved gait pattern    Time 4   Period Weeks   Status Achieved     PT SHORT TERM GOAL #3   Title Patient will be independent with intial HEP   Time 4   Period Weeks   Status Achieved     PT SHORT TERM GOAL #4   Title Patient will report 2/10 pain at worst    Baseline no pain    Time 4   Period Weeks   Status Achieved     PT SHORT TERM GOAL #5   Title Patient will demsotrate a 30 second single leg stance time   Baseline 30 seconds achieved moved to higher level activity    Time 4   Period Weeks   Status Achieved           PT Long Term  Goals - 10/10/15 1423      PT LONG TERM GOAL #1   Title Patientwill demsotrate 20 degrees of active ankle dorsi-flexion in order to quat down to pick item off the ground.    Time 8   Period Weeks   Status Unable to assess     PT LONG TERM GOAL #2   Title Patient will ambulate 1 mile without pain in order to ambulate around campus    Time 8   Period Weeks   Status Achieved     PT LONG TERM GOAL #3   Title Patient will go up/down 2 flights of stairs with a reciprocol gait pattern and without pain in order to reach his appartment safely.    Time 8   Period Weeks   Status Achieved               Plan - 10/27/15 0919    Clinical Impression Statement Patient was able to do heel traise machine with 40 lb weight today 3x10. He will try light jogging over the weekend on a track. He is having less tightness with movement drills.    Rehab Potential Excellent   PT Frequency 2x / week   PT Duration 8 weeks   PT Treatment/Interventions ADLs/Self Care Home Management;Cryotherapy;Electrical Stimulation;Iontophoresis 4mg /ml Dexamethasone;Functional mobility training;Stair training;Gait training;Moist Heat;Therapeutic activities;Therapeutic exercise;Balance training;Neuromuscular re-education;Patient/family education;Passive range of motion;Manual techniques;Dry needling;Splinting;Taping;Vasopneumatic Device;Visual/perceptual remediation/compensation   PT Next Visit Plan continue with functional training as tolerated, SLS, PF strength as tolerated   PT Home Exercise Plan continue,  may walk in water   Consulted and Agree with Plan of Care Patient      Patient will benefit from skilled therapeutic intervention in order to improve the following deficits and impairments:  Abnormal gait, Decreased range of motion, Difficulty walking, Decreased safety awareness, Pain, Increased muscle spasms, Decreased activity tolerance, Decreased strength, Decreased mobility, Decreased balance  Visit  Diagnosis: Pain in left ankle and joints of left foot  Difficulty in walking, not elsewhere classified  Muscle weakness (generalized)  Stiffness of left ankle, not elsewhere classified     Problem List Patient Active Problem List   Diagnosis Date Noted  . Rupture of left Achilles tendon 06/13/2015  . Achilles tendon rupture  06/13/2015  . Alopecia 01/03/2015    Dessie Coma PT DPT  10/27/2015, 9:25 AM  [facility] [address] [place], [place], [address] Phone: [phone]   Fax:  [phone]  Name: Larry Haas MRN: [id number] Date of Birth: May 15, 1995

## 2015-10-31 ENCOUNTER — Ambulatory Visit: Payer: 59 | Admitting: Physical Therapy

## 2015-10-31 DIAGNOSIS — M6281 Muscle weakness (generalized): Secondary | ICD-10-CM

## 2015-10-31 DIAGNOSIS — M25572 Pain in left ankle and joints of left foot: Secondary | ICD-10-CM | POA: Diagnosis not present

## 2015-10-31 DIAGNOSIS — R262 Difficulty in walking, not elsewhere classified: Secondary | ICD-10-CM

## 2015-10-31 DIAGNOSIS — M25672 Stiffness of left ankle, not elsewhere classified: Secondary | ICD-10-CM

## 2015-10-31 NOTE — Therapy (Signed)
[facility] [address] [place], [place], [address] Phone: [phone]   Fax:  [phone]  Physical Therapy Treatment  Patient Details  Name: Larry Haas MRN: [id number] Date of Birth: Jul 06, 1995 Referring Provider: Dr Annell Greening   Encounter Date: 10/31/2015      PT End of Session - 10/31/15 1513    Visit Number 12   Number of Visits 16   Date for PT Re-Evaluation 11/02/15   PT Start Time 0217   PT Stop Time 0300   PT Time Calculation (min) 43 min      Past Medical History:  Diagnosis Date  . Achilles tendon rupture    left  . Allergy   . Complication of anesthesia    Stop breathing twice after having tonsils removed and started "refluxing"  . Headache   . Pneumonia     Past Surgical History:  Procedure Laterality Date  . ACHILLES TENDON SURGERY Left 06/13/2015   Procedure: ACHILLES TENDON REPAIR;  Surgeon: Eldred Manges, MD;  Location: [facility] OR;  Service: Orthopedics;  Laterality: Left;  . APPENDECTOMY    . HERNIA REPAIR     At 58 months old   . TONSILLECTOMY      There were no vitals filed for this visit.      Subjective Assessment - 10/31/15 1420    Subjective I jogged  for 4-5 minutes yesterday on and off on the treadmill.   Currently in Pain? No/denies                         [facility] Adult PT Treatment/Exercise - 10/31/15 0001      Knee/Hip Exercises: Machines for Strengthening   Other Machine heel raise with left foot  assist 3x10 40 lb      Knee/Hip Exercises: Standing   Other Standing Knee Exercises attempting to skip, gallop, also hands on wall knee ups      Ankle Exercises: Aerobic   Tread Mill T.M. 2.5 mph up to 4.4 mph jogging 1 minute x 5 for a total of 5 minutes- pt fatigues and requests to stop     Ankle Exercises: Standing   Rebounder red and yellow ball tosses while on foam oval    Heel Raises 20 reps  eccentric lowering   Other Standing Ankle Exercises lunges  in doorway 4 x 2 each leg back    Other Standing Ankle Exercises bilateral jumping with UE support on counter      Ankle Exercises: Stretches   Soleus Stretch 20 seconds;2 reps   Gastroc Stretch 2 reps;30 seconds   Slant Board Stretch 60 seconds                  PT Short Term Goals - 10/24/15 1502      PT SHORT TERM GOAL #1   Title Patient will demsotrate 4+/5 gross left lower extremity pain    Baseline 2/5 PF strength 4+/5 hip abductor all other 5/5    Time 4   Period Weeks     PT SHORT TERM GOAL #2   Title Patient will ambulate 300' in the clinic without the brace with proper gait pattern    Baseline improved gait pattern    Time 4   Period Weeks   Status Achieved     PT SHORT TERM GOAL #3   Title Patient will be independent with intial HEP   Time 4   Period Weeks  Status Achieved     PT SHORT TERM GOAL #4   Title Patient will report 2/10 pain at worst    Baseline no pain    Time 4   Period Weeks   Status Achieved     PT SHORT TERM GOAL #5   Title Patient will demsotrate a 30 second single leg stance time   Baseline 30 seconds achieved moved to higher level activity    Time 4   Period Weeks   Status Achieved           PT Long Term Goals - 10/10/15 1423      PT LONG TERM GOAL #1   Title Patientwill demsotrate 20 degrees of active ankle dorsi-flexion in order to quat down to pick item off the ground.    Time 8   Period Weeks   Status Unable to assess     PT LONG TERM GOAL #2   Title Patient will ambulate 1 mile without pain in order to ambulate around campus    Time 8   Period Weeks   Status Achieved     PT LONG TERM GOAL #3   Title Patient will go up/down 2 flights of stairs with a reciprocol gait pattern and without pain in order to reach his appartment safely.    Time 8   Period Weeks   Status Achieved               Plan - 10/31/15 1508    Clinical Impression Statement Pt able to begin lunges without pain only fatigue.  Unable to single heel raise. He was able to bilateral hop in place with UE on counter to decrease weight through LEs. Unable to skip and gallop with correct mechanics. Pt able to jog at 4.5 moh on T.M for 1 minute intervals for a total of 5 minutes without increased pain.    PT Next Visit Plan continue with functional training as tolerated, SLS, PF strength as tolerated      Patient will benefit from skilled therapeutic intervention in order to improve the following deficits and impairments:  Abnormal gait, Decreased range of motion, Difficulty walking, Decreased safety awareness, Pain, Increased muscle spasms, Decreased activity tolerance, Decreased strength, Decreased mobility, Decreased balance  Visit Diagnosis: Pain in left ankle and joints of left foot  Difficulty in walking, not elsewhere classified  Muscle weakness (generalized)  Stiffness of left ankle, not elsewhere classified     Problem List Patient Active Problem List   Diagnosis Date Noted  . Rupture of left Achilles tendon 06/13/2015  . Achilles tendon rupture 06/13/2015  . Alopecia 01/03/2015    Sherrie Mustacheonoho, Larry Haas, PTA 10/31/2015, 3:20 PM  [facility] [address] [place], [place], [address] Phone: [phone]   Fax:  [phone]  Name: Larry Haas MRN: [id number] Date of Birth: 01/11/1996

## 2015-11-02 ENCOUNTER — Ambulatory Visit: Payer: 59 | Admitting: Physical Therapy

## 2015-11-02 DIAGNOSIS — R262 Difficulty in walking, not elsewhere classified: Secondary | ICD-10-CM

## 2015-11-02 DIAGNOSIS — M25672 Stiffness of left ankle, not elsewhere classified: Secondary | ICD-10-CM

## 2015-11-02 DIAGNOSIS — M25572 Pain in left ankle and joints of left foot: Secondary | ICD-10-CM

## 2015-11-02 DIAGNOSIS — M6281 Muscle weakness (generalized): Secondary | ICD-10-CM

## 2015-11-03 NOTE — Therapy (Signed)
[place], [place], [address] Phone: [phone]   Fax:  [phone]  Physical Therapy Treatment/ Re-cert  Patient Details  Name: Larry Haas MRN: [id number] Date of Birth: Sep 05, 1995 Referring Provider: Dr Rodell Perna   Encounter Date: 11/02/2015      PT End of Session - 11/02/15 1419    Visit Number 13   Number of Visits 16   Date for PT Re-Evaluation 12/14/15   PT Start Time 4259   PT Stop Time 1456   PT Time Calculation (min) 41 min   Activity Tolerance Patient tolerated treatment well   Behavior During Therapy [facility] for tasks assessed/performed      Past Medical History:  Diagnosis Date  . Achilles tendon rupture    left  . Allergy   . Complication of anesthesia    Stop breathing twice after having tonsils removed and started "refluxing"  . Headache   . Pneumonia     Past Surgical History:  Procedure Laterality Date  . ACHILLES TENDON SURGERY Left 06/13/2015   Procedure: ACHILLES TENDON REPAIR;  Surgeon: Marybelle Killings, MD;  Location: [place];  Service: Orthopedics;  Laterality: Left;  . APPENDECTOMY    . HERNIA REPAIR     At 17 months old   . TONSILLECTOMY      There were no vitals filed for this visit.      Subjective Assessment - 11/03/15 0746    Subjective Patient reports he has been doing well.  He has been doing light running. He has been working on his single leg raise.    Pertinent History nothing significant    Limitations Standing;Walking   How long can you sit comfortably? No limited    How long can you stand comfortably? < 1 hour    How long can you walk comfortably? limited community distances    Diagnostic tests Nothing post -op    Patient Stated Goals Back to normal life activity    Currently in Pain? No/denies            [facility] PT Assessment - 11/03/15 0001      Figure 8 Edema   Figure 8 - Right  43.4   Figure 8 - Left  45.3     AROM   Overall AROM  Comments Right lower extremity and left knee and hip WNL    Left Ankle Dorsiflexion 10   Left Ankle Plantar Flexion 28   Left Ankle Inversion 27   Left Ankle Eversion 15     Strength   Left Ankle Dorsiflexion 5/5   Left Ankle Plantar Flexion 3+/5  Not tested    Left Ankle Inversion 5/5   Left Ankle Eversion 5/5                     [facility] Adult PT Treatment/Exercise - 11/03/15 0001      Ambulation/Gait   Gait Comments good gait pattern. Unable to perfrom straight line running 2nd to continued gastroc weakness.      Knee/Hip Exercises: Machines for Strengthening   Cybex Leg Press 3x10 eccentric 160    Other Machine heel raise with left foot  assist 3x10 40 lb      Knee/Hip Exercises: Standing   Other Standing Knee Exercises cone touches to floor 2x10; standing yellow medicine ball toss 2x10 single leg stance; Wall punches 2x10 Double punches x10 right x10 left; side shuffles, low skips 30'x4 light jog 30'x4 some  tightness still noted but improved. Lunges 10lb weights bilateral 2x10; Step downs 6 inch x20      Ankle Exercises: Aerobic   Tread Mill T.M. 2.5 mph up to 4.4 mph jogging 1 minute x 5 for a total of 5 minutes- pt fatigues and requests to stop     Ankle Exercises: Standing   Rebounder red and yellow ball tosses while on foam oval    Heel Raises 20 reps  eccentric lowering   Other Standing Ankle Exercises Standing with right leg back for stability 2x10 heel raise leg straight 2x10 knee bent; cable walk forward / back 13.5 KG     Other Standing Ankle Exercises Rocker board fwd/ back x20; sid eto side 20x      Ankle Exercises: Stretches   Soleus Stretch 20 seconds;2 reps   Gastroc Stretch 2 reps;30 seconds   Slant Board Stretch 60 seconds                PT Education - 11/03/15 0749    Education provided Yes   Education Details reviewed HEP for the next few weeks    Person(s) Educated Patient   Methods Explanation;Demonstration   Comprehension  Verbalized understanding;Returned demonstration          PT Short Term Goals - 11/03/15 0754      PT SHORT TERM GOAL #1   Title Patient will demsotrate 4+/5 gross left lower extremity pain    Baseline 3/5 PF strength. All other strength 5/5    Time 4   Period Weeks   Status Partially Met     PT SHORT TERM GOAL #2   Title Patient will ambulate 300' in the clinic without the brace with proper gait pattern    Baseline improved gait pattern    Time 4   Period Weeks   Status Achieved     PT SHORT TERM GOAL #3   Title Patient will be independent with intial HEP   Time 4   Period Weeks   Status Achieved     PT SHORT TERM GOAL #4   Title Patient will report 2/10 pain at worst    Baseline no pain    Time 4   Period Weeks   Status Achieved     PT SHORT TERM GOAL #5   Title Patient will demsotrate a 30 second single leg stance time   Baseline 30 seconds achieved moved to higher level activity    Time 4   Period Weeks   Status Achieved           PT Long Term Goals - 11/03/15 0755      PT LONG TERM GOAL #1   Title Patientwill demsotrate 20 degrees of active ankle dorsi-flexion in order to quat down to pick item off the ground.    Baseline 10 degrees on the right as well. Has tight calfs.    Time 8   Period Weeks   Status On-going     PT LONG TERM GOAL #2   Title Patient will ambulate 1 mile without pain in order to ambulate around campus    Time 8   Period Weeks   Status Achieved     PT LONG TERM GOAL #3   Title Patient will go up/down 2 flights of stairs with a reciprocol gait pattern and without pain in order to reach his appartment safely.    Time 8   Period Weeks   Status Achieved  Plan - 11/03/15 0751    Clinical Impression Statement Patient is amking good progress at this time. He will be seen 1x every 2 weeks until he is back to straight line running with no pain. He is not able to run yet and needs assistance for single leg raise.  He will be seen 1x every 2 weeks until he is back to straight line running. He will proceed back to sports per rpotocol and MD discussion.    Rehab Potential Excellent   PT Frequency Biweekly   PT Duration 6 weeks   PT Treatment/Interventions ADLs/Self Care Home Management;Cryotherapy;Electrical Stimulation;Iontophoresis 15m/ml Dexamethasone;Functional mobility training;Stair training;Gait training;Moist Heat;Therapeutic activities;Therapeutic exercise;Balance training;Neuromuscular re-education;Patient/family education;Passive range of motion;Manual techniques;Dry needling;Splinting;Taping;Vasopneumatic Device;Visual/perceptual remediation/compensation   PT Next Visit Plan continue with functional training as tolerated, SLS, PF strength as tolerated   PT Home Exercise Plan continue,  may walk in water   Consulted and Agree with Plan of Care Patient      Patient will benefit from skilled therapeutic intervention in order to improve the following deficits and impairments:  Abnormal gait, Decreased range of motion, Difficulty walking, Decreased safety awareness, Pain, Increased muscle spasms, Decreased activity tolerance, Decreased strength, Decreased mobility, Decreased balance  Visit Diagnosis: Pain in left ankle and joints of left foot - Plan: PT plan of care cert/re-cert  Difficulty in walking, not elsewhere classified - Plan: PT plan of care cert/re-cert  Muscle weakness (generalized) - Plan: PT plan of care cert/re-cert  Stiffness of left ankle, not elsewhere classified - Plan: PT plan of care cert/re-cert     Problem List Patient Active Problem List   Diagnosis Date Noted  . Rupture of left Achilles tendon 06/13/2015  . Achilles tendon rupture 06/13/2015  . Alopecia 01/03/2015    DCarney LivingPT DPT  11/03/2015, 11:01 AM  [facility] [address]Phone: [phone]  Fax:   [phone] Name: KFinch CostanzoMRN: [id number] of Birth: [birth year]

## 2015-11-14 ENCOUNTER — Ambulatory Visit: Payer: 59 | Attending: Orthopaedic Surgery | Admitting: Physical Therapy

## 2015-11-14 DIAGNOSIS — R262 Difficulty in walking, not elsewhere classified: Secondary | ICD-10-CM | POA: Insufficient documentation

## 2015-11-14 DIAGNOSIS — M25572 Pain in left ankle and joints of left foot: Secondary | ICD-10-CM | POA: Diagnosis present

## 2015-11-14 DIAGNOSIS — M6281 Muscle weakness (generalized): Secondary | ICD-10-CM | POA: Diagnosis present

## 2015-11-14 DIAGNOSIS — M25672 Stiffness of left ankle, not elsewhere classified: Secondary | ICD-10-CM | POA: Diagnosis present

## 2015-11-15 NOTE — Therapy (Signed)
[place], [place], [address] Phone: [phone]   Fax:  [phone]  Physical Therapy Treatment  Patient Details  Name: Larry Haas MRN: [id number] Date of Birth: 07/22/1995 Referring Provider: Dr Rodell Perna   Encounter Date: 11/14/2015      PT End of Session - 11/14/15 1604    Visit Number 14   Number of Visits 16   Date for PT Re-Evaluation 12/14/15   PT Start Time 8938   PT Stop Time 1457   PT Time Calculation (min) 42 min   Activity Tolerance Patient tolerated treatment well   Behavior During Therapy [facility] for tasks assessed/performed      Past Medical History:  Diagnosis Date  . Achilles tendon rupture    left  . Allergy   . Complication of anesthesia    Stop breathing twice after having tonsils removed and started "refluxing"  . Headache   . Pneumonia     Past Surgical History:  Procedure Laterality Date  . ACHILLES TENDON SURGERY Left 06/13/2015   Procedure: ACHILLES TENDON REPAIR;  Surgeon: Marybelle Killings, MD;  Location: [facility];  Service: Orthopedics;  Laterality: Left;  . APPENDECTOMY    . HERNIA REPAIR     At 75 months old   . TONSILLECTOMY      There were no vitals filed for this visit.      Subjective Assessment - 11/14/15 1422    Subjective Patient reports he has been a little sore but it is because he has been running. he was able to run a mile running the straight aways and walking the curves.    Pertinent History nothing significant    Limitations Standing;Walking   How long can you sit comfortably? No limited    How long can you stand comfortably? < 1 hour    How long can you walk comfortably? limited community distances    Diagnostic tests Nothing post -op    Patient Stated Goals Back to normal life activity    Currently in Pain? Yes   Pain Score 2    Pain Location Ankle   Pain Descriptors / Indicators Aching   Pain Onset More than a month ago   Pain  Frequency Intermittent   Aggravating Factors  standing, walking    Pain Relieving Factors rest and ice    Effect of Pain on Daily Activities unable to run and play sports    Multiple Pain Sites No                         [facility] Adult PT Treatment/Exercise - 11/14/15 0001      Knee/Hip Exercises: Machines for Strengthening   Cybex Leg Press 3x10 eccentric 160    Other Machine heel raise with left foot  assist 3x10 40 lb      Knee/Hip Exercises: Standing   Other Standing Knee Exercises bosu squats, Plyometric jumps forward / back 3x30sec hold; cable walks 16kg 5x fwd 5x back; Sinlge leg hops onto bosu x15 fwd x15 back; Single leg ball toss yellow ball x20                 PT Education - 11/14/15 1424    Education provided Yes   Education Details reviewed HEP    Person(s) Educated Patient   Methods Explanation   Comprehension Verbalized understanding;Returned demonstration          PT Short Term Goals -  11/03/15 0754      PT SHORT TERM GOAL #1   Title Patient will demsotrate 4+/5 gross left lower extremity pain    Baseline 3/5 PF strength. All other strength 5/5    Time 4   Period Weeks   Status Partially Met     PT SHORT TERM GOAL #2   Title Patient will ambulate 300' in the clinic without the brace with proper gait pattern    Baseline improved gait pattern    Time 4   Period Weeks   Status Achieved     PT SHORT TERM GOAL #3   Title Patient will be independent with intial HEP   Time 4   Period Weeks   Status Achieved     PT SHORT TERM GOAL #4   Title Patient will report 2/10 pain at worst    Baseline no pain    Time 4   Period Weeks   Status Achieved     PT SHORT TERM GOAL #5   Title Patient will demsotrate a 30 second single leg stance time   Baseline 30 seconds achieved moved to higher level activity    Time 4   Period Weeks   Status Achieved           PT Long Term Goals - 11/03/15 0755      PT LONG TERM GOAL #1   Title  Patientwill demsotrate 20 degrees of active ankle dorsi-flexion in order to quat down to pick item off the ground.    Baseline 10 degrees on the right as well. Has tight calfs.    Time 8   Period Weeks   Status On-going     PT LONG TERM GOAL #2   Title Patient will ambulate 1 mile without pain in order to ambulate around campus    Time 8   Period Weeks   Status Achieved     PT LONG TERM GOAL #3   Title Patient will go up/down 2 flights of stairs with a reciprocol gait pattern and without pain in order to reach his appartment safely.    Time 8   Period Weeks   Status Achieved               Plan - 11/15/15 0746    Clinical Impression Statement Patient is making good progress at this time. He was able to perfrom plyometrics without pain. He has started running. He will come back in two weeks to assess tolerance to plyometrics. He will likely D/C in the next 2 visits    Rehab Potential Excellent   PT Frequency Biweekly   PT Duration 6 weeks   PT Treatment/Interventions ADLs/Self Care Home Management;Cryotherapy;Electrical Stimulation;Iontophoresis 68m/ml Dexamethasone;Functional mobility training;Stair training;Gait training;Moist Heat;Therapeutic activities;Therapeutic exercise;Balance training;Neuromuscular re-education;Patient/family education;Passive range of motion;Manual techniques;Dry needling;Splinting;Taping;Vasopneumatic Device;Visual/perceptual remediation/compensation   PT Next Visit Plan continue with functional training as tolerated, SLS, PF strength as tolerated   PT Home Exercise Plan continue,  may walk in water   Consulted and Agree with Plan of Care Patient      Patient will benefit from skilled therapeutic intervention in order to improve the following deficits and impairments:  Abnormal gait, Decreased range of motion, Difficulty walking, Decreased safety awareness, Pain, Increased muscle spasms, Decreased activity tolerance, Decreased strength, Decreased  mobility, Decreased balance  Visit Diagnosis: Pain in left ankle and joints of left foot  Difficulty in walking, not elsewhere classified  Muscle weakness (generalized)  Stiffness of left ankle, not elsewhere classified  Problem List Patient Active Problem List   Diagnosis Date Noted  . Rupture of left Achilles tendon 06/13/2015  . Achilles tendon rupture 06/13/2015  . Alopecia 01/03/2015    Carney Living PT DPT  11/15/2015, 7:53 AM  [facility] [address] [place], [place], [address] Phone: [phone]   Fax:  [phone]  Name: Larry Haas MRN: [id number] Date of Birth: 1995-04-21

## 2015-11-28 ENCOUNTER — Ambulatory Visit: Payer: 59 | Admitting: Physical Therapy

## 2015-12-12 ENCOUNTER — Ambulatory Visit: Payer: 59 | Attending: Orthopaedic Surgery | Admitting: Physical Therapy

## 2015-12-12 DIAGNOSIS — M25572 Pain in left ankle and joints of left foot: Secondary | ICD-10-CM

## 2015-12-12 DIAGNOSIS — M6281 Muscle weakness (generalized): Secondary | ICD-10-CM | POA: Diagnosis present

## 2015-12-12 DIAGNOSIS — R262 Difficulty in walking, not elsewhere classified: Secondary | ICD-10-CM | POA: Diagnosis present

## 2015-12-12 NOTE — Therapy (Signed)
[place], [place], [address] Phone: [phone]   Fax:  [phone]  Physical Therapy Treatment  Patient Details  Name: Larry Haas MRN: [id number] Date of Birth: Aug 10, 1995 Referring Provider: Dr Rodell Perna   Encounter Date: 12/12/2015      PT End of Session - 12/12/15 1416    Visit Number 15   Number of Visits 16   Date for PT Re-Evaluation 12/14/15   PT Start Time 7209   PT Stop Time 1500   PT Time Calculation (min) 43 min   Activity Tolerance Patient tolerated treatment well   Behavior During Therapy [facility] for tasks assessed/performed      Past Medical History:  Diagnosis Date  . Achilles tendon rupture    left  . Allergy   . Complication of anesthesia    Stop breathing twice after having tonsils removed and started "refluxing"  . Headache   . Pneumonia     Past Surgical History:  Procedure Laterality Date  . ACHILLES TENDON SURGERY Left 06/13/2015   Procedure: ACHILLES TENDON REPAIR;  Surgeon: Marybelle Killings, MD;  Location: [place];  Service: Orthopedics;  Laterality: Left;  . APPENDECTOMY    . HERNIA REPAIR     At 9 months old   . TONSILLECTOMY      There were no vitals filed for this visit.      Subjective Assessment - 12/12/15 1421    Subjective Patient reports a little tightness in his opposite achillies. He has been running with no pain or tightness in his right achillies.    Pertinent History nothing significant    Limitations Standing;Walking   How long can you sit comfortably? No limited    How long can you stand comfortably? < 1 hour    How long can you walk comfortably? limited community distances    Diagnostic tests Nothing post -op    Patient Stated Goals Back to normal life activity    Currently in Pain? No/denies                         [facility] Adult PT Treatment/Exercise - 12/12/15 0001      Knee/Hip Exercises: Machines for Strengthening   Cybex Leg Press 3x10 eccentric 160    Other Machine heel raise with left foot  assist 3x10 40 lb      Knee/Hip Exercises: Standing   Other Standing Knee Exercises bosu squats, Plyometric jumps forward / back 3x30sec hold; cable walks 16kg 5x fwd 5x back; Sinlge leg hops onto bosu x15 fwd x15 back; Single leg ball toss yellow ball x20      Ankle Exercises: Standing   Rebounder red and yellow ball tosses while on foam oval    Heel Raises 20 reps  eccentric lowering   Other Standing Ankle Exercises Standing with right leg back for stability 2x10 heel raise leg straight 2x10 knee bent; cable walk forward / back 13.5 KG     Other Standing Ankle Exercises Rocker board fwd/ back x20; sid eto side 20x      Ankle Exercises: Stretches   Slant Board Stretch 60 seconds                PT Education - 12/12/15 1519    Education provided Yes   Person(s) Educated Patient   Methods Explanation   Comprehension Verbalized understanding;Returned demonstration          PT Short Term  Goals - 12/12/15 1521      PT SHORT TERM GOAL #1   Title Patient will demsotrate 4+/5 gross left lower extremity pain    Baseline 3/5 PF strength. All other strength 5/5    Time 4   Period Weeks   Status Partially Met     PT SHORT TERM GOAL #2   Title Patient will ambulate 300' in the clinic without the brace with proper gait pattern    Baseline improved gait pattern    Time 4   Period Weeks   Status Achieved     PT SHORT TERM GOAL #3   Title Patient will be independent with intial HEP   Time 4   Period Weeks   Status Achieved     PT SHORT TERM GOAL #4   Title Patient will report 2/10 pain at worst    Baseline no pain    Time 4   Period Weeks   Status Achieved     PT SHORT TERM GOAL #5   Title Patient will demsotrate a 30 second single leg stance time   Baseline 30 seconds achieved moved to higher level activity    Time 4   Period Weeks   Status Achieved           PT Long Term Goals  - 11/03/15 0755      PT LONG TERM GOAL #1   Title Patientwill demsotrate 20 degrees of active ankle dorsi-flexion in order to quat down to pick item off the ground.    Baseline 10 degrees on the right as well. Has tight calfs.    Time 8   Period Weeks   Status On-going     PT LONG TERM GOAL #2   Title Patient will ambulate 1 mile without pain in order to ambulate around campus    Time 8   Period Weeks   Status Achieved     PT LONG TERM GOAL #3   Title Patient will go up/down 2 flights of stairs with a reciprocol gait pattern and without pain in order to reach his appartment safely.    Time 8   Period Weeks   Status Achieved               Plan - 12/12/15 1519    Clinical Impression Statement Patient continues to make good progress. He can perform a single leg heel lift with minimal upper extremity support. PT will review light jump program next visit and likely discharge the paiteint.    Rehab Potential Excellent   PT Frequency Biweekly   PT Treatment/Interventions ADLs/Self Care Home Management;Cryotherapy;Electrical Stimulation;Iontophoresis 16m/ml Dexamethasone;Functional mobility training;Stair training;Gait training;Moist Heat;Therapeutic activities;Therapeutic exercise;Balance training;Neuromuscular re-education;Patient/family education;Passive range of motion;Manual techniques;Dry needling;Splinting;Taping;Vasopneumatic Device;Visual/perceptual remediation/compensation   PT Next Visit Plan light jump program    PT Home Exercise Plan continue,  may walk in water   Consulted and Agree with Plan of Care Patient      Patient will benefit from skilled therapeutic intervention in order to improve the following deficits and impairments:  Abnormal gait, Decreased range of motion, Difficulty walking, Decreased safety awareness, Pain, Increased muscle spasms, Decreased activity tolerance, Decreased strength, Decreased mobility, Decreased balance  Visit Diagnosis: Pain in left  ankle and joints of left foot  Difficulty in walking, not elsewhere classified  Muscle weakness (generalized)     Problem List Patient Active Problem List   Diagnosis Date Noted  . Rupture of left Achilles tendon 06/13/2015  . Achilles tendon rupture  06/13/2015  . Alopecia 01/03/2015    Carney Living PT DPT  12/12/2015, 3:24 PM  [facility] [address] [place], [place], [address] Phone: [phone]   Fax:  [phone]  Name: Alioune Hodgkin MRN: [id number] Date of Birth: 08/07/1995

## 2015-12-26 ENCOUNTER — Ambulatory Visit: Payer: 59 | Admitting: Physical Therapy

## 2016-01-09 ENCOUNTER — Ambulatory Visit: Payer: 59 | Attending: Orthopaedic Surgery | Admitting: Physical Therapy

## 2016-01-09 DIAGNOSIS — M25572 Pain in left ankle and joints of left foot: Secondary | ICD-10-CM | POA: Diagnosis present

## 2016-01-09 DIAGNOSIS — M25672 Stiffness of left ankle, not elsewhere classified: Secondary | ICD-10-CM | POA: Diagnosis present

## 2016-01-09 DIAGNOSIS — R262 Difficulty in walking, not elsewhere classified: Secondary | ICD-10-CM | POA: Insufficient documentation

## 2016-01-09 DIAGNOSIS — M6281 Muscle weakness (generalized): Secondary | ICD-10-CM | POA: Diagnosis present

## 2016-01-09 NOTE — Therapy (Addendum)
[place], [place], [address] Phone: [phone]   Fax:  [phone]  Physical Therapy Treatment/ Discharge Patient Details  Name: Larry Haas MRN: [id number] Date of Birth: 06-01-1995 Referring Provider: Dr Rodell Perna   Encounter Date: 01/09/2016      PT End of Session - 01/09/16 2025    Visit Number 16   Number of Visits 16   Date for PT Re-Evaluation 12/14/15   PT Start Time 6837   PT Stop Time 1500   PT Time Calculation (min) 43 min   Activity Tolerance Patient tolerated treatment well   Behavior During Therapy [facility] for tasks assessed/performed      Past Medical History:  Diagnosis Date  . Achilles tendon rupture    left  . Allergy   . Complication of anesthesia    Stop breathing twice after having tonsils removed and started "refluxing"  . Headache   . Pneumonia     Past Surgical History:  Procedure Laterality Date  . ACHILLES TENDON SURGERY Left 06/13/2015   Procedure: ACHILLES TENDON REPAIR;  Surgeon: Marybelle Killings, MD;  Location: [facility];  Service: Orthopedics;  Laterality: Left;  . APPENDECTOMY    . HERNIA REPAIR     At 27 months old   . TONSILLECTOMY      There were no vitals filed for this visit.      Subjective Assessment - 01/09/16 1424    Subjective Patient is making great progress. he ha no pain and he is back to running.    Pertinent History nothing significant    Limitations Standing;Walking   How long can you sit comfortably? No limited    How long can you stand comfortably? < 1 hour    How long can you walk comfortably? limited community distances    Diagnostic tests Nothing post -op    Patient Stated Goals Back to normal life activity    Currently in Pain? No/denies   Pain Score --                         [facility] Adult PT Treatment/Exercise - 01/09/16 0001      Therapeutic Activites    Therapeutic Activities Other Therapeutic Activities    Other Therapeutic Activities Light jumping program. Return to sport program:  wall jumps 2x10; squat jumps in low range, 180 jumps , broad jump for technique not for distance 2x30sec      Knee/Hip Exercises: Machines for Strengthening   Cybex Leg Press 3x10 eccentric 160    Other Machine heel raise with left foot  assist 3x10 80 lb                 PT Education - 01/09/16 2024    Education provided Yes   Education Details reviewed HEP    Person(s) Educated Patient   Methods Explanation   Comprehension Verbalized understanding;Returned demonstration          PT Short Term Goals - 01/09/16 2039      PT SHORT TERM GOAL #1   Title Patient will demsotrate 4+/5 gross left lower extremity pain    Baseline 4+/5 PF stretch    Time 4   Period Weeks   Status Partially Met     PT SHORT TERM GOAL #2   Title Patient will ambulate 300' in the clinic without the brace with proper gait pattern    Baseline improved gait pattern  Time 4   Period Weeks   Status Achieved     PT SHORT TERM GOAL #3   Title Patient will be independent with intial HEP   Time 4   Period Weeks   Status Achieved     PT SHORT TERM GOAL #4   Title Patient will report 2/10 pain at worst    Baseline no pain    Time 4   Period Weeks   Status Achieved     PT SHORT TERM GOAL #5   Title Patient will demsotrate a 30 second single leg stance time   Baseline 30 seconds achieved moved to higher level activity    Time 4   Period Weeks   Status Achieved           PT Long Term Goals - 01/09/16 2040      PT LONG TERM GOAL #1   Title Patient will demsotrate 20 degrees of active ankle dorsi-flexion in order to quat down to pick item off the ground.    Baseline 10 degrees on the right as well. Has tight calfs.    Time 8   Status Achieved     PT LONG TERM GOAL #2   Title Patient will ambulate 1 mile without pain in order to ambulate around campus    Time 8   Period Weeks   Status Achieved     PT  LONG TERM GOAL #3   Title Patient will go up/down 2 flights of stairs with a reciprocol gait pattern and without pain in order to reach his appartment safely.    Baseline No pain with steps    Time 8   Period Weeks   Status Achieved               Plan - 01/09/16 2031    Clinical Impression Statement Patient has made great progress. He is back to jogging and back to lifting weights. He can do a single leg heel lift with minimal upper extremity support. He was given a light return to jump/ plyomtric program. He had no pain. He was advised not to go back to asketball by MD. If he does he was advised to wait until at least a year for his achillies to heal.    Rehab Potential Excellent   PT Frequency Biweekly   PT Duration 6 weeks   PT Treatment/Interventions ADLs/Self Care Home Management;Cryotherapy;Electrical Stimulation;Iontophoresis 5m/ml Dexamethasone;Functional mobility training;Stair training;Gait training;Moist Heat;Therapeutic activities;Therapeutic exercise;Balance training;Neuromuscular re-education;Patient/family education;Passive range of motion;Manual techniques;Dry needling;Splinting;Taping;Vasopneumatic Device;Visual/perceptual remediation/compensation   PT Home Exercise Plan D/C to HEP    Consulted and Agree with Plan of Care Patient      Patient will benefit from skilled therapeutic intervention in order to improve the following deficits and impairments:  Abnormal gait, Decreased range of motion, Difficulty walking, Decreased safety awareness, Pain, Increased muscle spasms, Decreased activity tolerance, Decreased strength, Decreased mobility, Decreased balance  Visit Diagnosis: Pain in left ankle and joints of left foot  Difficulty in walking, not elsewhere classified  Muscle weakness (generalized)  Stiffness of left ankle, not elsewhere classified  PHYSICAL THERAPY DISCHARGE SUMMARY  Visits from Start of Care: 16  Current functional level related to goals /  functional outcomes: Had returned to all light activity    Remaining deficits: Not back playing basketball   Education / Equipment: HEP  Plan: Patient agrees to discharge.  Patient goals were not met. Patient is being discharged due to being pleased with the current functional level.  ?????  Problem List Patient Active Problem List   Diagnosis Date Noted  . Rupture of left Achilles tendon 06/13/2015  . Achilles tendon rupture 06/13/2015  . Alopecia 01/03/2015    Carney Living PT DPT  01/09/2016, 8:46 PM  [facility] [address] [place], [place], [address] Phone: [phone]   Fax:  [phone]  Name: Larry Haas MRN: [id number] Date of Birth: 1995/10/26

## 2016-01-23 ENCOUNTER — Encounter: Payer: Self-pay | Admitting: Physical Therapy

## 2016-08-07 IMAGING — CR DG CHEST 2V
2 series · 2 of 2 positions shown · non-contrast
Comparison: None.

CLINICAL DATA: Cough for 2 months.

EXAM:
CHEST  2 VIEW

[PA]
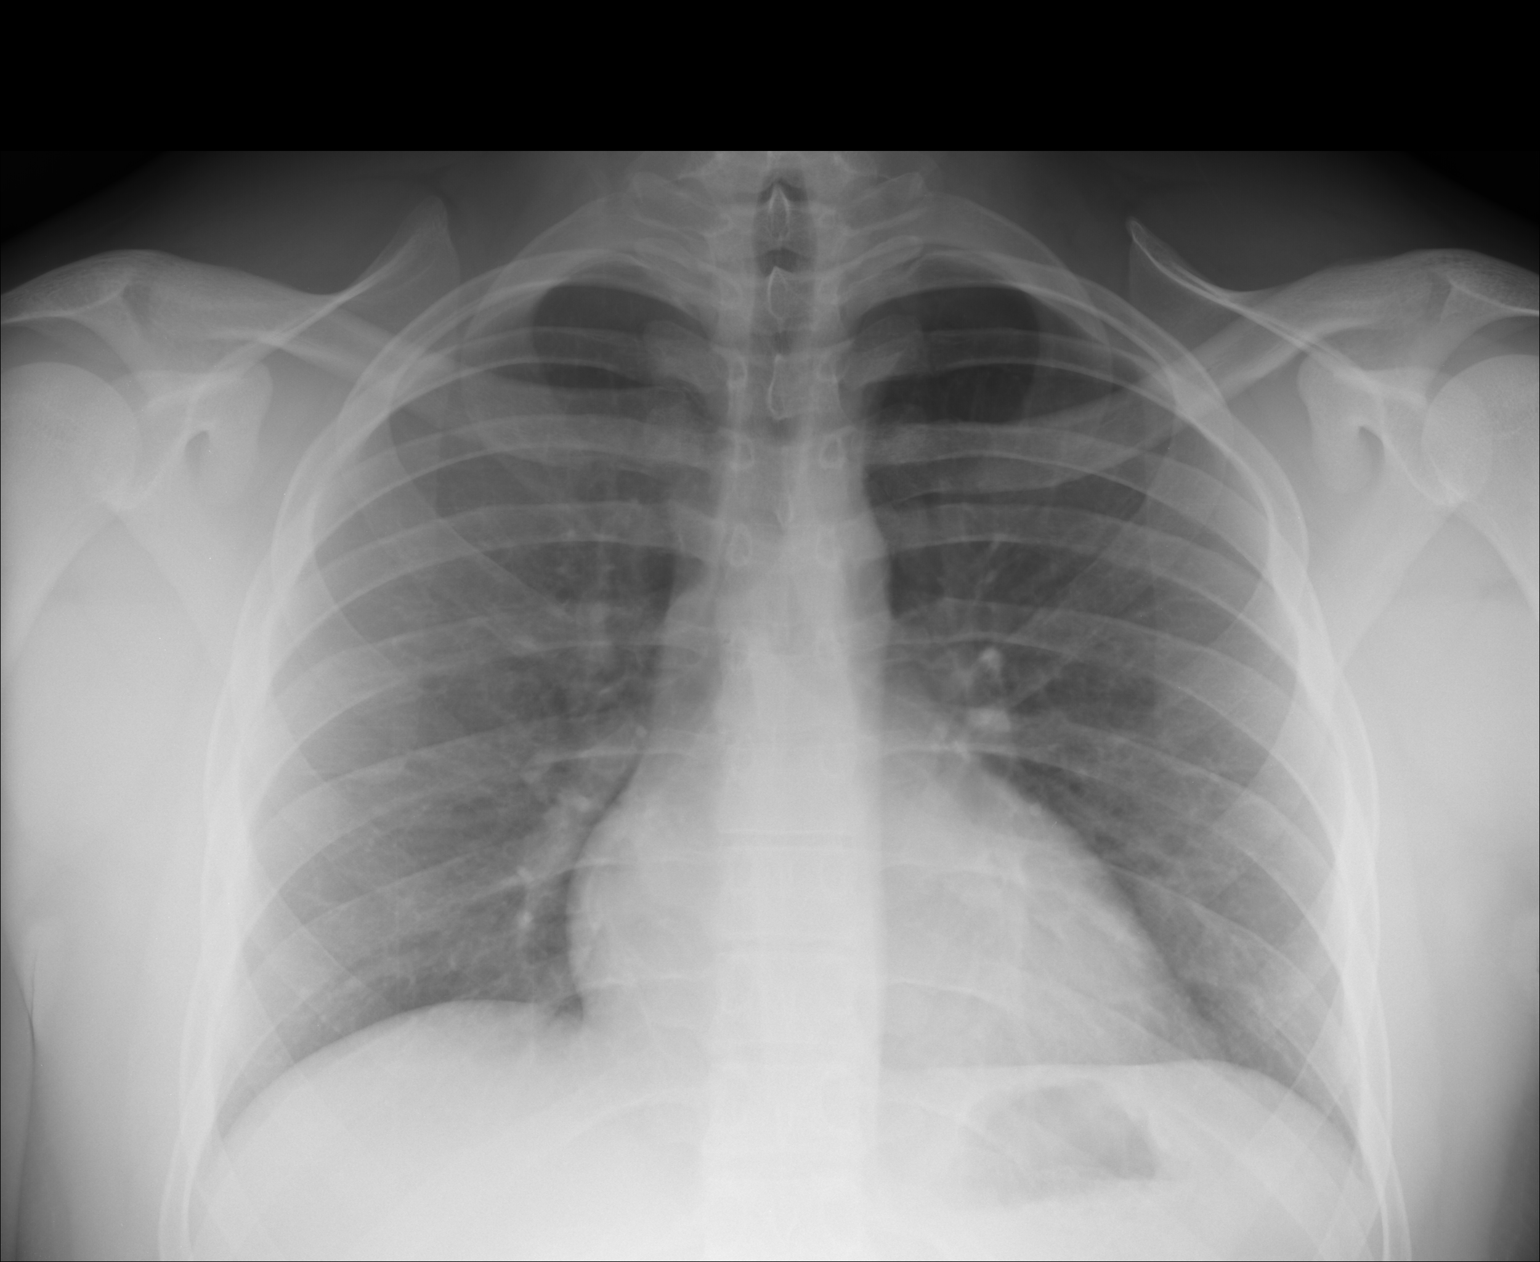

[lateral]
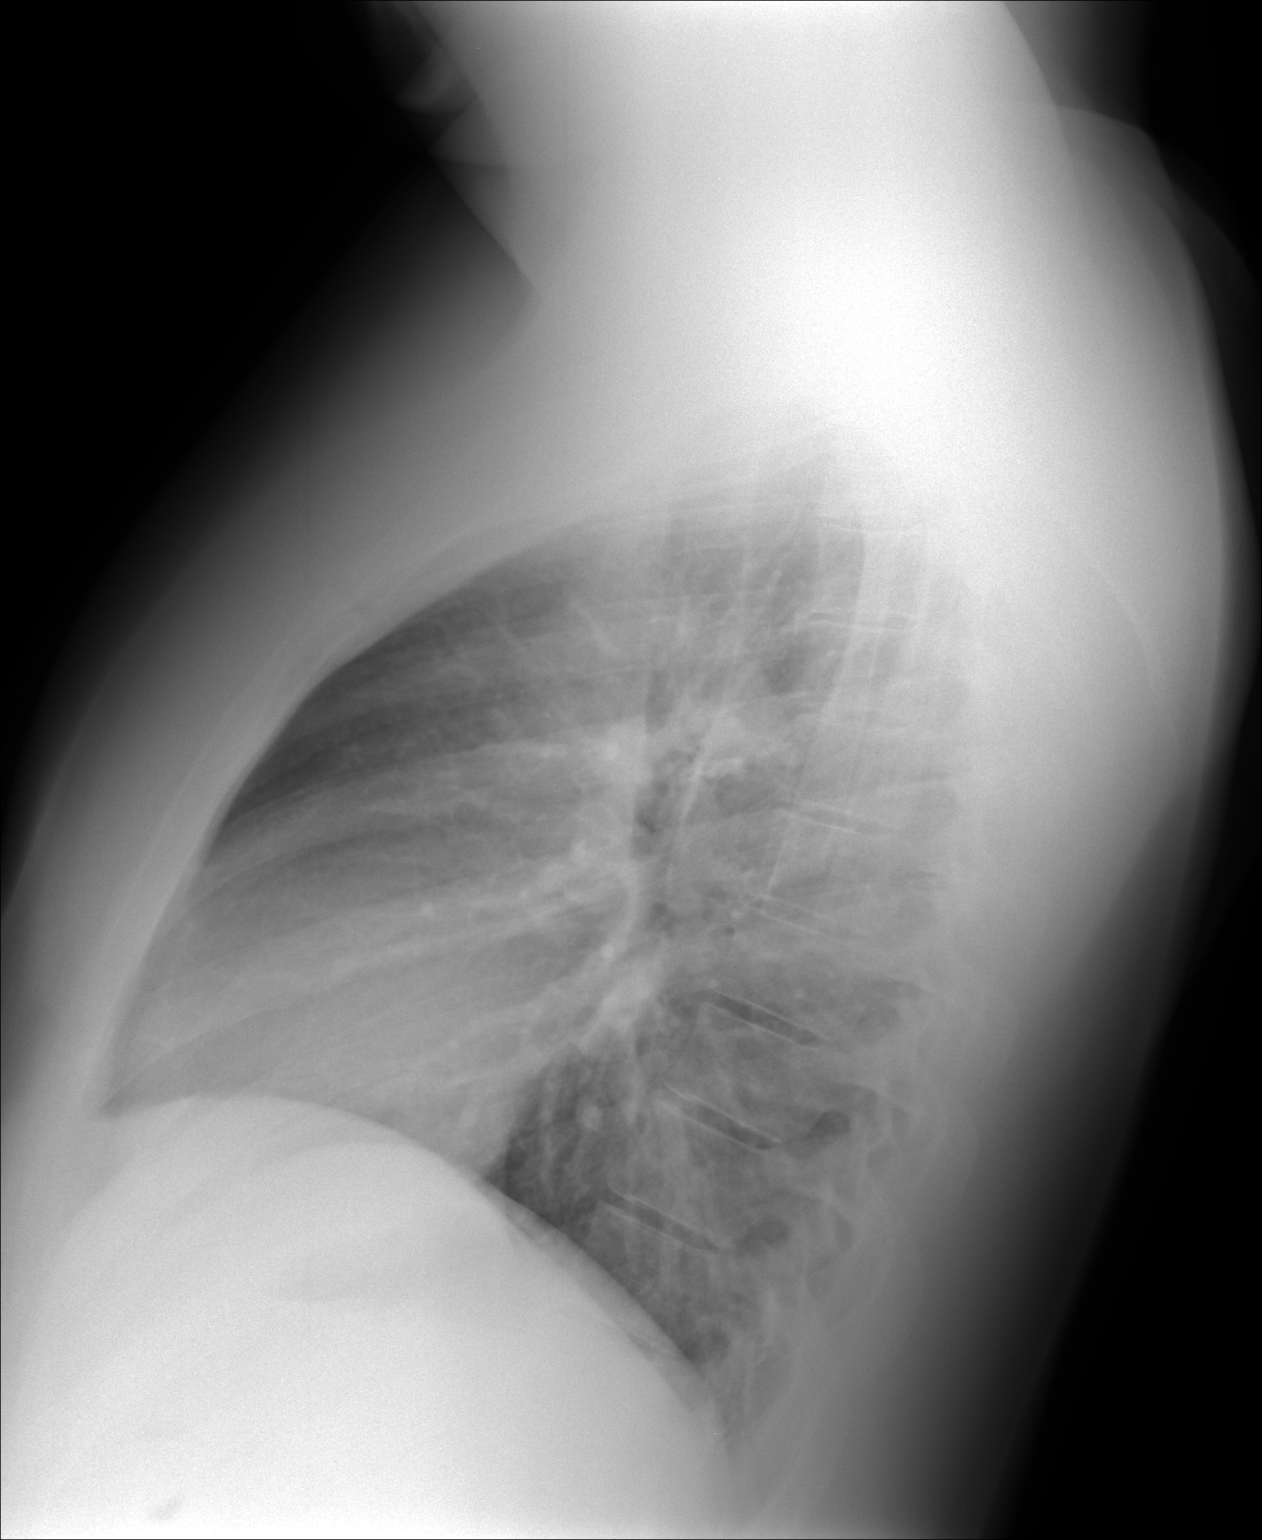

[2 of 2 positions shown; findings below may reference images not displayed]

FINDINGS: The heart size and mediastinal contours are within normal limits.
Both lungs are clear. No pneumothorax or pleural effusion is noted.
The visualized skeletal structures are unremarkable.
IMPRESSION: No active cardiopulmonary disease.

## 2017-01-08 IMAGING — DX DG ANKLE COMPLETE 3+V*L*
3 series · 3 of 3 positions shown · non-contrast
Comparison: None

CLINICAL DATA: Landed wrong on LEFT ankle playing basketball today,
LEFT ankle pain and swelling, initial encounter

EXAM:
LEFT ANKLE COMPLETE - 3+ VIEW

[x ankle ap left]
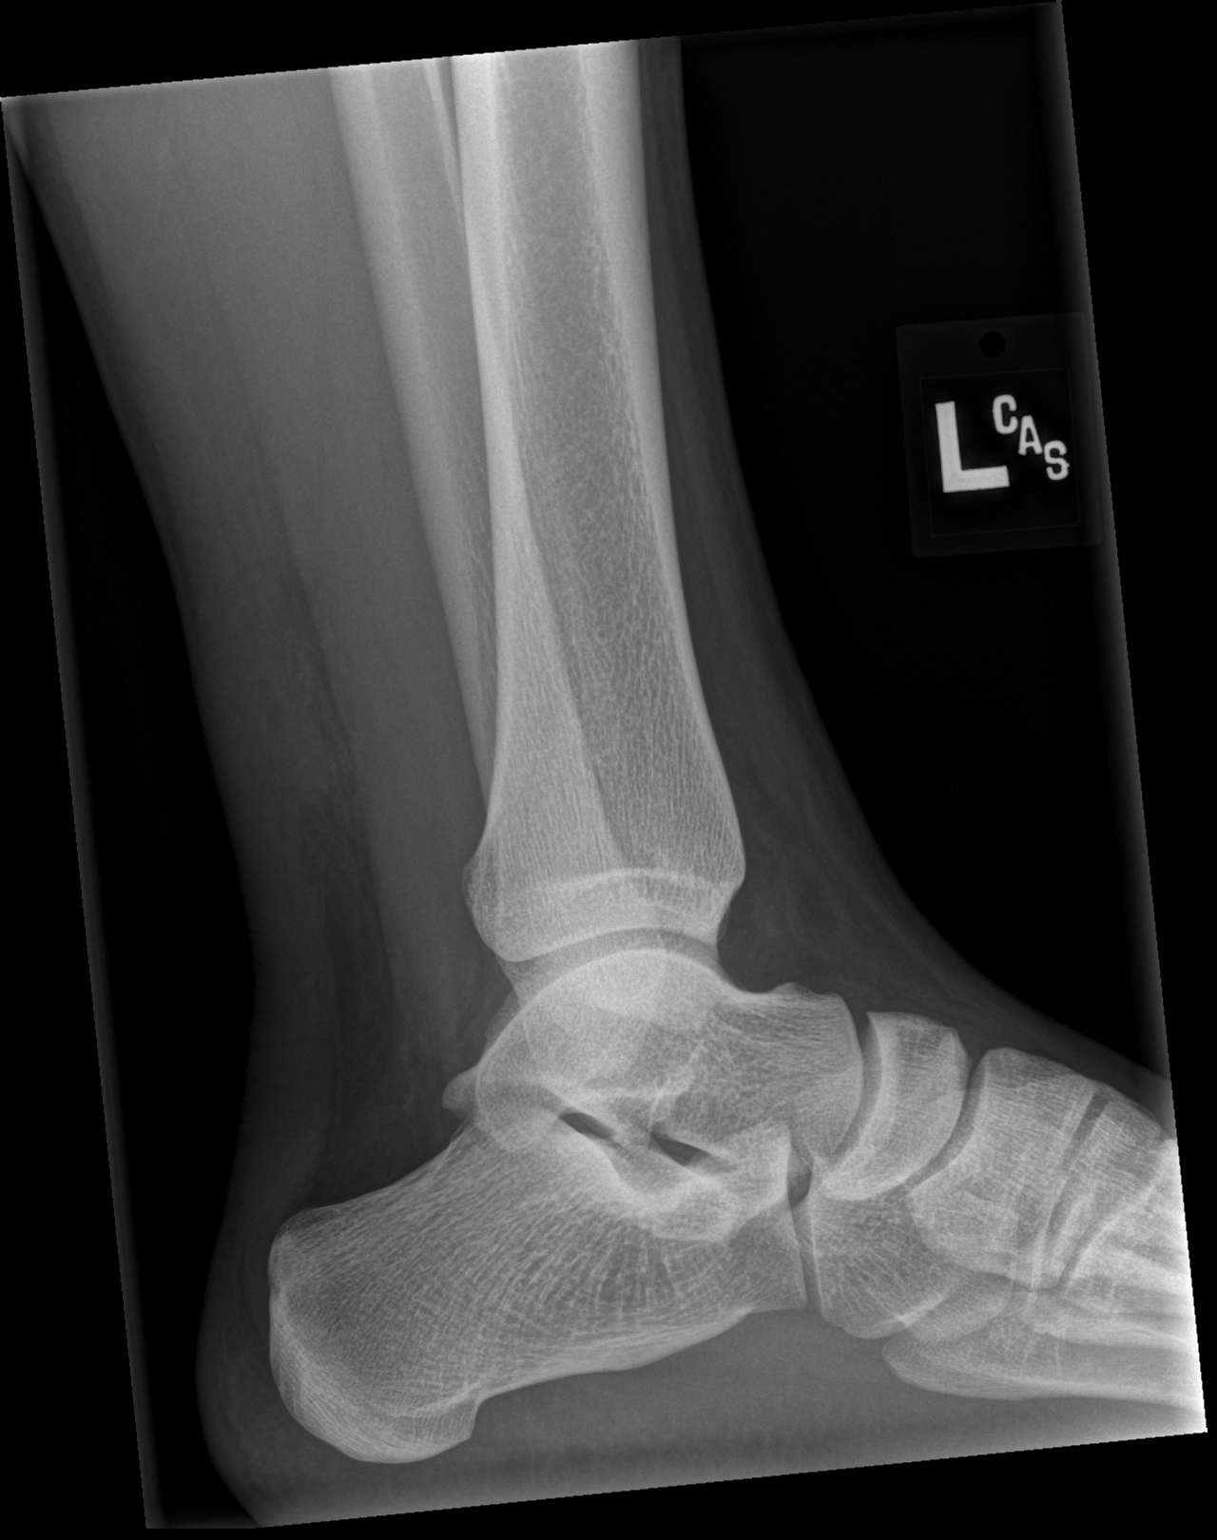

[x ankle obl left]
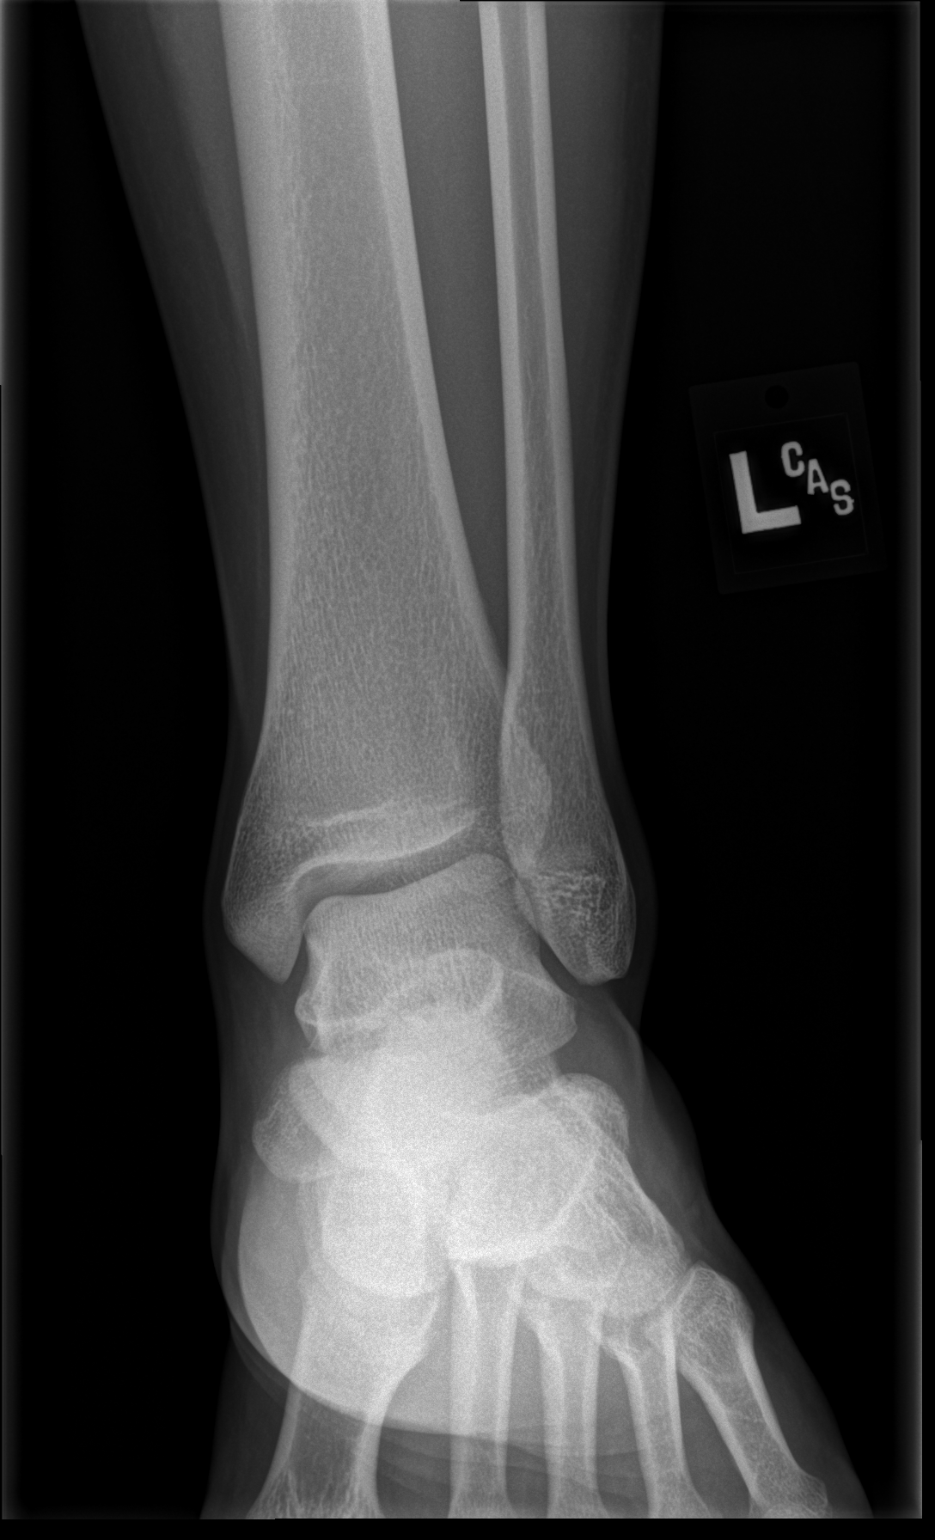

[x ankle lat left]
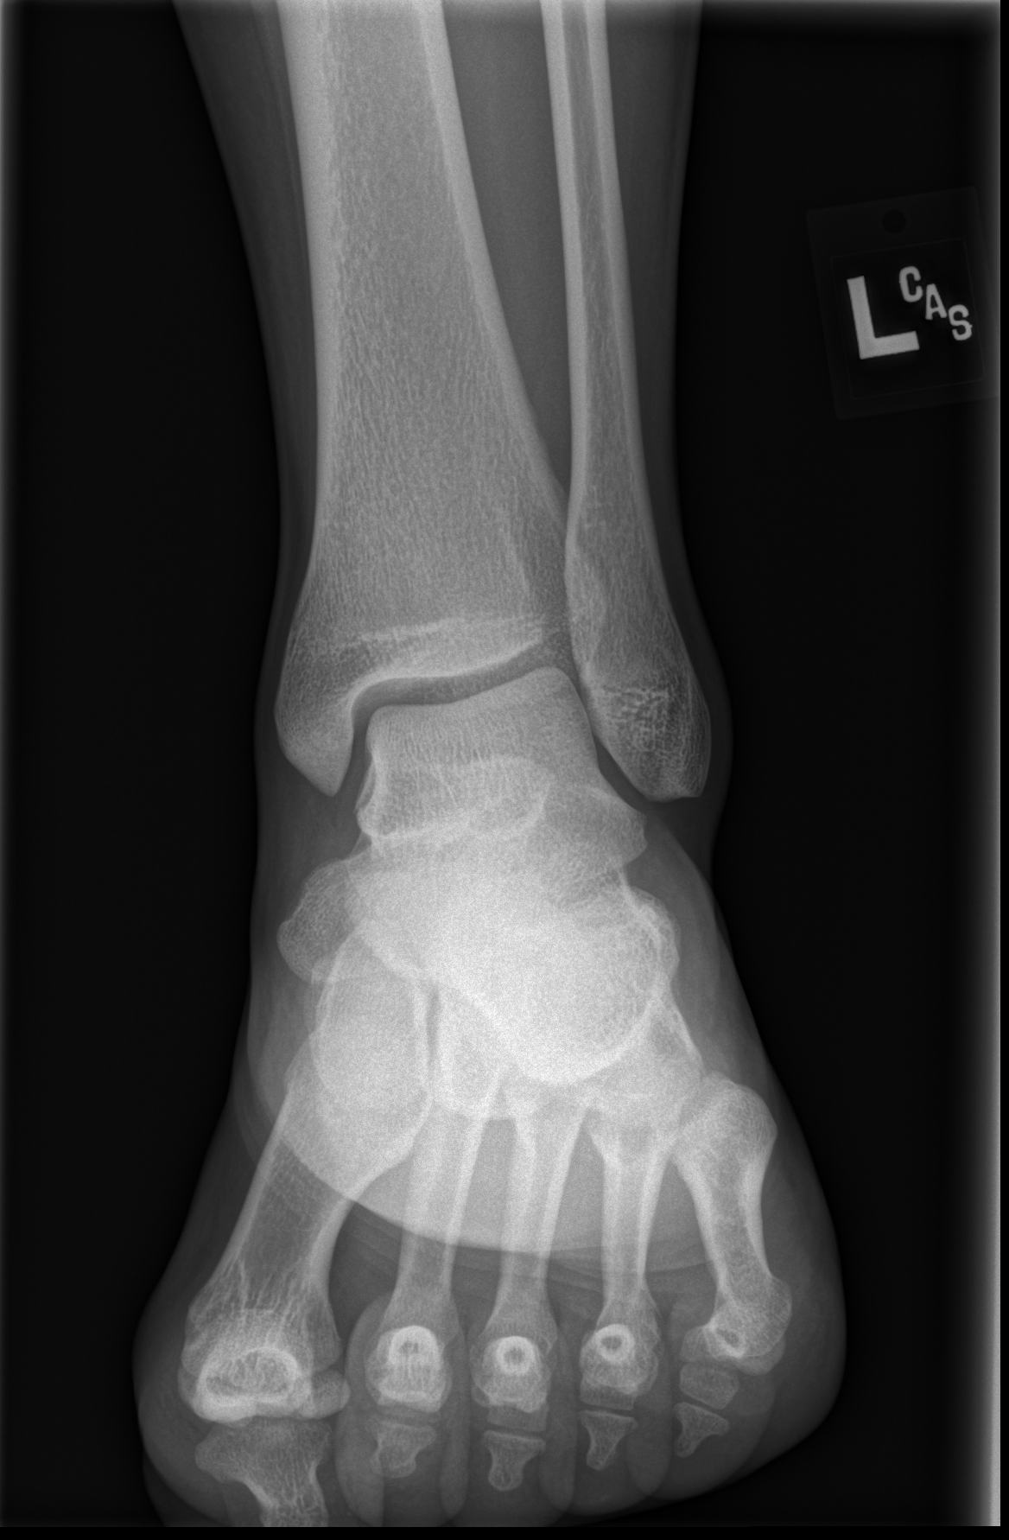

[3 of 3 positions shown; findings below may reference images not displayed]

FINDINGS: Osseous mineralization normal.

Joint spaces preserved.

No fracture, dislocation, or bone destruction.
IMPRESSION: Normal exam.

## 2017-03-14 ENCOUNTER — Ambulatory Visit: Payer: 59 | Admitting: Urgent Care

## 2017-03-14 ENCOUNTER — Other Ambulatory Visit: Payer: Self-pay

## 2017-03-14 ENCOUNTER — Encounter: Payer: Self-pay | Admitting: Urgent Care

## 2017-03-14 VITALS — BP 122/82 | HR 93 | Temp 98.5°F | Resp 16 | Ht 72.75 in | Wt 264.0 lb

## 2017-03-14 DIAGNOSIS — R52 Pain, unspecified: Secondary | ICD-10-CM

## 2017-03-14 DIAGNOSIS — R05 Cough: Secondary | ICD-10-CM

## 2017-03-14 DIAGNOSIS — R6889 Other general symptoms and signs: Secondary | ICD-10-CM

## 2017-03-14 DIAGNOSIS — R51 Headache: Secondary | ICD-10-CM | POA: Diagnosis not present

## 2017-03-14 DIAGNOSIS — J029 Acute pharyngitis, unspecified: Secondary | ICD-10-CM

## 2017-03-14 DIAGNOSIS — R6883 Chills (without fever): Secondary | ICD-10-CM

## 2017-03-14 DIAGNOSIS — R059 Cough, unspecified: Secondary | ICD-10-CM

## 2017-03-14 DIAGNOSIS — R519 Headache, unspecified: Secondary | ICD-10-CM

## 2017-03-14 LAB — POC INFLUENZA A&B (BINAX/QUICKVUE)
INFLUENZA B, POC: NEGATIVE
Influenza A, POC: NEGATIVE

## 2017-03-14 LAB — POCT RAPID STREP A (OFFICE): RAPID STREP A SCREEN: NEGATIVE

## 2017-03-14 MED ORDER — HYDROCODONE-HOMATROPINE 5-1.5 MG/5ML PO SYRP: 5.0000 mL | ORAL_SOLUTION | Freq: Every evening | ORAL | 0 refills | Status: DC | PRN

## 2017-03-14 MED ORDER — BENZONATATE 100 MG PO CAPS: 100.0000 mg | ORAL_CAPSULE | Freq: Three times a day (TID) | ORAL | 0 refills | Status: DC | PRN

## 2017-03-14 MED ORDER — OSELTAMIVIR PHOSPHATE 75 MG PO CAPS
75.0000 mg | ORAL_CAPSULE | Freq: Two times a day (BID) | ORAL | 0 refills | Status: DC
Start: 2017-03-14 — End: 2017-03-16

## 2017-03-14 NOTE — Patient Instructions (Addendum)
You may take 500mg  Tylenol with ibuprofen 400-600mg  every 6 hours for body aches, pain and inflammation. Hydrate well with at least 2 liters (1 gallon) of water daily. For sore throat try using a honey-based tea. Use 3 teaspoons of honey with juice squeezed from half lemon. Place shaved pieces of ginger into 1/2-1 cup of water and warm over stove top. Then mix the ingredients and repeat every 4 hours as needed.     Influenza, Adult Influenza, more commonly known as "the flu," is a viral infection that primarily affects the respiratory tract. The respiratory tract includes organs that help you breathe, such as the lungs, nose, and throat. The flu causes many common cold symptoms, as well as a high fever and body aches. The flu spreads easily from person to person (is contagious). Getting a flu shot (influenza vaccination) every year is the best way to prevent influenza. What are the causes? Influenza is caused by a virus. You can catch the virus by:  Breathing in droplets from an infected person's cough or sneeze.  Touching something that was recently contaminated with the virus and then touching your mouth, nose, or eyes.  What increases the risk? The following factors may make you more likely to get the flu:  Not cleaning your hands frequently with soap and water or alcohol-based hand sanitizer.  Having close contact with many people during cold and flu season.  Touching your mouth, eyes, or nose without washing or sanitizing your hands first.  Not drinking enough fluids or not eating a healthy diet.  Not getting enough sleep or exercise.  Being under a high amount of stress.  Not getting a yearly (annual) flu shot.  You may be at a higher risk of complications from the flu, such as a severe lung infection (pneumonia), if you:  Are over the age of 965.  Are pregnant.  Have a weakened disease-fighting system (immune system). You may have a weakened immune system if you: ? Have  HIV or AIDS. ? Are undergoing chemotherapy. ? Aretaking medicines that reduce the activity of (suppress) the immune system.  Have a long-term (chronic) illness, such as heart disease, kidney disease, diabetes, or lung disease.  Have a liver disorder.  Are obese.  Have anemia.  What are the signs or symptoms? Symptoms of this condition typically last 4-10 days and may include:  Fever.  Chills.  Headache, body aches, or muscle aches.  Sore throat.  Cough.  Runny or congested nose.  Chest discomfort and cough.  Poor appetite.  Weakness or tiredness (fatigue).  Dizziness.  Nausea or vomiting.  How is this diagnosed? This condition may be diagnosed based on your medical history and a physical exam. Your health care provider may do a nose or throat swab test to confirm the diagnosis. How is this treated? If influenza is detected early, you can be treated with antiviral medicine that can reduce the length of your illness and the severity of your symptoms. This medicine may be given by mouth (orally) or through an IV tube that is inserted in one of your veins. The goal of treatment is to relieve symptoms by taking care of yourself at home. This may include taking over-the-counter medicines, drinking plenty of fluids, and adding humidity to the air in your home. In some cases, influenza goes away on its own. Severe influenza or complications from influenza may be treated in a hospital. Follow these instructions at home:  Take over-the-counter and prescription medicines only as  told by your health care provider.  Use a cool mist humidifier to add humidity to the air in your home. This can make breathing easier.  Rest as needed.  Drink enough fluid to keep your urine clear or pale yellow.  Cover your mouth and nose when you cough or sneeze.  Wash your hands with soap and water often, especially after you cough or sneeze. If soap and water are not available, use hand  sanitizer.  Stay home from work or school as told by your health care provider. Unless you are visiting your health care provider, try to avoid leaving home until your fever has been gone for 24 hours without the use of medicine.  Keep all follow-up visits as told by your health care provider. This is important. How is this prevented?  Getting an annual flu shot is the best way to avoid getting the flu. You may get the flu shot in late summer, fall, or winter. Ask your health care provider when you should get your flu shot.  Wash your hands often or use hand sanitizer often.  Avoid contact with people who are sick during cold and flu season.  Eat a healthy diet, drink plenty of fluids, get enough sleep, and exercise regularly. Contact a health care provider if:  You develop new symptoms.  You have: ? Chest pain. ? Diarrhea. ? A fever.  Your cough gets worse.  You produce more mucus.  You feel nauseous or you vomit. Get help right away if:  You develop shortness of breath or difficulty breathing.  Your skin or nails turn a bluish color.  You have severe pain or stiffness in your neck.  You develop a sudden headache or sudden pain in your face or ear.  You cannot stop vomiting. This information is not intended to replace advice given to you by your health care provider. Make sure you discuss any questions you have with your health care provider. Document Released: 01/05/2000 Document Revised: 06/15/2015 Document Reviewed: 11/01/2014 Elsevier Interactive Patient Education  2017 [employer].    IF you received an x-ray today, you will receive an invoice from [facility]. Please contact [facility] at [phone] with questions or concerns regarding your invoice.   IF you received labwork today, you will receive an invoice from [place]. Please contact [facility] at [phone] with questions or concerns regarding your invoice.   Our billing staff will  not be able to assist you with questions regarding bills from these companies.  You will be contacted with the lab results as soon as they are available. The fastest way to get your results is to activate your My Chart account. Instructions are located on the last page of this paperwork. If you have not heard from [place] regarding the results in 2 weeks, please contact this office.

## 2017-03-14 NOTE — Progress Notes (Signed)
  MRN: [id number] DOB: 05/03/1995  Subjective:   Larry Haas is a 22 y.o. male presenting for 1 day history of body aches, subjective fever, headaches, runny nose, nasal congestion, bilateral ear fullness, sore throat, productive cough, shortness of breath, nausea without vomiting. Has tried Advil with minimal relief. Denies sinus pain, ear pain, chest pain, wheezing, abdominal pain, rashes. Denies smoking cigarettes.  Larry Haas currently has no medications in their medication list. Also has No Known Allergies.  Larry Haas  has a past medical history of Achilles tendon rupture, Allergy, Complication of anesthesia, Headache, and Pneumonia. Also  has a past surgical history that includes Tonsillectomy; Appendectomy; Hernia repair; and Achilles tendon surgery (Left, 06/13/2015).  Objective:   Vitals: BP 122/82   Pulse 93   Temp 98.5 F (36.9 C) (Oral)   Resp 16   Ht 6' 0.75" (1.848 m)   Wt 264 lb (119.7 kg)   SpO2 97%   BMI 35.07 kg/m   Physical Exam  Constitutional: He is oriented to person, place, and time. He appears well-developed and well-nourished.  HENT:  TM's intact bilaterally, no effusions or erythema. Nasal turbinates dry, nasal passages patent. No sinus tenderness. Oropharynx clear, mucous membranes moist.    Eyes: Right eye exhibits no discharge. Left eye exhibits no discharge.  Neck: Normal range of motion. Neck supple.  Bilateral anterior cervical lymph node tenderness.  Cardiovascular: Normal rate, regular rhythm and intact distal pulses. Exam reveals no gallop and no friction rub.  No murmur heard. Pulmonary/Chest: No respiratory distress. He has no wheezes. He has no rales.  Musculoskeletal: He exhibits no edema.  Lymphadenopathy:    He has no cervical adenopathy.  Neurological: He is alert and oriented to person, place, and time.  Skin: Skin is warm and dry.  Psychiatric: He has a normal mood and affect.   Results for orders placed or performed in visit on  03/14/17 (from the past 24 hour(s))  POC Influenza A&B (Binax test)     Status: None   Collection Time: 03/14/17 12:10 PM  Result Value Ref Range   Influenza A, POC Negative Negative   Influenza B, POC Negative Negative  POCT rapid strep A     Status: None   Collection Time: 03/14/17 12:10 PM  Result Value Ref Range   Rapid Strep A Screen Negative Negative   Assessment and Plan :   Flu-like symptoms - Plan: POC Influenza A&B (Binax test)  Sore throat - Plan: POCT rapid strep A, Culture, Group A Strep  Cough  Generalized headaches  Body aches  Chills  Will manage supportively for flu like illness. Supportive care recommended. Return-to-clinic precautions discussed, patient verbalized understanding.   Wallis BambergMario Demyan Fugate, PA-C [facility] [phone] 03/14/2017  12:02 PM

## 2017-03-16 ENCOUNTER — Encounter (HOSPITAL_COMMUNITY): Payer: Self-pay | Admitting: Family Medicine

## 2017-03-16 ENCOUNTER — Ambulatory Visit (HOSPITAL_COMMUNITY)
Admission: EM | Admit: 2017-03-16 | Discharge: 2017-03-16 | Disposition: A | Discharge status: Home / Self Care | Payer: 59 | Attending: Family Medicine | Admitting: Family Medicine

## 2017-03-16 DIAGNOSIS — H1030 Unspecified acute conjunctivitis, unspecified eye: Secondary | ICD-10-CM

## 2017-03-16 DIAGNOSIS — B349 Viral infection, unspecified: Secondary | ICD-10-CM | POA: Diagnosis not present

## 2017-03-16 LAB — CULTURE, GROUP A STREP: Strep A Culture: NEGATIVE

## 2017-03-16 MED ORDER — POLYMYXIN B-TRIMETHOPRIM 10000-0.1 UNIT/ML-% OP SOLN: 2.0000 [drp] | OPHTHALMIC | 0 refills | Status: DC

## 2017-03-16 NOTE — ED Provider Notes (Signed)
03/16/2017 1:10 PM   DOB: 09/28/1995 / MRN: [id number]  SUBJECTIVE:  Larry Haas is a 22 y.o. male presenting for bilateral red eyes that started 2 days ago.  Feels he is getting worse.  Was seen across town and diagnosed with influenza-like illness.  Has been taking Tamiflu along with Hycodan and Tessalon.  He has No Known Allergies.   He  has a past medical history of Achilles tendon rupture, Allergy, Complication of anesthesia, Headache, and Pneumonia.    He  reports that  has never smoked. he has never used smokeless tobacco. He reports that he drinks alcohol. He reports that he does not use drugs. He  reports that he currently engages in sexual activity. He reports using the following method of birth control/protection: Condom. The patient  has a past surgical history that includes Tonsillectomy; Appendectomy; Hernia repair; and Achilles tendon surgery (Left, 06/13/2015).  His family history includes Cancer in his maternal grandfather; Diabetes in his maternal grandmother and paternal grandmother; Heart disease in his maternal grandfather; Hyperlipidemia in his maternal grandfather, maternal grandmother, mother, and paternal grandfather; Hypertension in his maternal grandfather, maternal grandmother, mother, and paternal grandfather; Mental illness in his paternal grandfather.  Review of Systems  Constitutional: Negative for chills, diaphoresis and fever.  Eyes: Negative.   Respiratory: Positive for cough. Negative for hemoptysis, sputum production, shortness of breath and wheezing.   Cardiovascular: Negative for chest pain, orthopnea and leg swelling.  Gastrointestinal: Negative for nausea.  Skin: Negative for rash.  Neurological: Negative for dizziness, sensory change, speech change, focal weakness and headaches.    OBJECTIVE:  BP 133/83 (BP Location: Left Arm)   Pulse 93   Temp 98.1 F (36.7 C) (Oral)   Resp 18   SpO2 99%   Physical Exam  Constitutional: He appears  well-developed. He is active and cooperative.  Non-toxic appearance.  HENT:  Mouth/Throat: Uvula is midline, oropharynx is clear and moist and mucous membranes are normal.  Bilateral conjunctivitis without preauricular lymphadenopathy.  Exudate noted in the right eye.  Lids negative for tenderness and swelling.  Cardiovascular: Normal rate, regular rhythm, S1 normal, S2 normal, normal heart sounds, intact distal pulses and normal pulses. Exam reveals no gallop and no friction rub.  No murmur heard. Pulmonary/Chest: Effort normal. No stridor. No tachypnea. No respiratory distress. He has no wheezes. He has no rales.  Abdominal: He exhibits no distension.  Musculoskeletal: He exhibits no edema.  Neurological: He is alert.  Skin: Skin is warm and dry. He is not diaphoretic. No pallor.  Vitals reviewed.   Results for orders placed or performed in visit on 03/14/17 (from the past 72 hour(s))  POC Influenza A&B (Binax test)     Status: None   Collection Time: 03/14/17 12:10 PM  Result Value Ref Range   Influenza A, POC Negative Negative   Influenza B, POC Negative Negative  POCT rapid strep A     Status: None   Collection Time: 03/14/17 12:10 PM  Result Value Ref Range   Rapid Strep A Screen Negative Negative  Culture, Group A Strep     Status: None (Preliminary result)   Collection Time: 03/14/17  2:23 PM  Result Value Ref Range   Strep A Culture Comment     Comment: Culture has been reviewed and is still in progress.    No results found.  ASSESSMENT AND PLAN:  No orders of the defined types were placed in this encounter.    Viral syndrome: Most likely  adenovirus given symptomology.  Advised warm compress.  Polytrim sending to the pharmacy refilled if he is not getting better.  Advised warm compress time.  Acute conjunctivitis, unspecified acute conjunctivitis type, unspecified laterality      The patient is advised to call or return to clinic if he does not see an  improvement in symptoms, or to seek the care of the closest emergency department if he worsens with the above plan.   Deliah Boston, MHS, PA-C 03/16/2017 1:10 PM   Ofilia Neas, PA-C 03/16/17 1310

## 2017-03-16 NOTE — ED Provider Notes (Signed)
[facility]   [id number] 03/16/17 Arrival Time: 1210   SUBJECTIVE:  Larry Haas is a 22 y.o. male who presents to the urgent care with complaint of flu symptoms since Feb 21 (she's on Tamiflu) and eye irritation.  Note from 03/14/17: Larry Haas is a 22 y.o. male presenting for 1 day history of body aches, subjective fever, headaches, runny nose, nasal congestion, bilateral ear fullness, sore throat, productive cough, shortness of breath, nausea without vomiting. Has tried Advil with minimal relief. Denies sinus pain, ear pain, chest pain, wheezing, abdominal pain, rashes. Denies smoking cigarettes.  Larry Haas currently has no medications in their medication list. Also has No Known Allergies.  Larry Haas  has a past medical history of Achilles tendon rupture, Allergy, Complication of anesthesia, Headache, and Pneumonia. Also  has a past surgical history that includes Tonsillectomy; Appendectomy; Hernia repair; and Achilles tendon surgery (Left, 06/13/2015).   Past Medical History:  Diagnosis Date  . Achilles tendon rupture    left  . Allergy   . Complication of anesthesia    Stop breathing twice after having tonsils removed and started "refluxing"  . Headache   . Pneumonia    Family History  Problem Relation Age of Onset  . Hyperlipidemia Mother   . Hypertension Mother   . Diabetes Maternal Grandmother   . Hyperlipidemia Maternal Grandmother   . Hypertension Maternal Grandmother   . Cancer Maternal Grandfather   . Heart disease Maternal Grandfather   . Hyperlipidemia Maternal Grandfather   . Hypertension Maternal Grandfather   . Diabetes Paternal Grandmother   . Hyperlipidemia Paternal Grandfather   . Hypertension Paternal Grandfather   . Mental illness Paternal Grandfather    Social History   Socioeconomic History  . Marital status: Single    Spouse name: Not on file  . Number of children: Not on file  . Years of education: Not on file  . Highest  education level: Not on file  Social Needs  . Financial resource strain: Not on file  . Food insecurity - worry: Not on file  . Food insecurity - inability: Not on file  . Transportation needs - medical: Not on file  . Transportation needs - non-medical: Not on file  Occupational History  . Occupation: Consulting civil engineertudent  Tobacco Use  . Smoking status: Never Smoker  . Smokeless tobacco: Never Used  Substance and Sexual Activity  . Alcohol use: Yes    Comment: occasional  . Drug use: No  . Sexual activity: Yes    Birth control/protection: Condom    Comment: 6-7 male partners currently. Condom most times.   Other Topics Concern  . Not on file  Social History Narrative   In school at [employer]. Majoring in Teacher, musicentrepreneurship. Sophomore. Lives in dorm on campus. Played bball in high school. Playing intramurals.    No outpatient medications have been marked as taking for the 03/16/17 encounter [facility](Hospital Encounter).   No Known Allergies    ROS: As per HPI, remainder of ROS negative.   OBJECTIVE:   Vitals:   03/16/17 1252  BP: 133/83  Pulse: 93  Resp: 18  Temp: 98.1 F (36.7 C)  TempSrc: Oral  SpO2: 99%     General appearance: alert; no distress Eyes: PERRL; EOMI; conjunctiva normal HENT: normocephalic; atraumatic; TMs normal, canal normal, external ears normal without trauma; nasal mucosa normal; oral mucosa normal Neck: supple Lungs: clear to auscultation bilaterally Heart: regular rate and rhythm Abdomen: soft, non-tender; bowel sounds normal; no masses or  organomegaly; no guarding or rebound tenderness Back: no CVA tenderness Extremities: no cyanosis or edema; symmetrical with no gross deformities Skin: warm and dry Neurologic: normal gait; grossly normal Psychological: alert and cooperative; normal mood and affect      Labs:  Results for orders placed or performed in visit on 03/14/17  Culture, Group A Strep  Result Value Ref Range   Strep A Culture Comment   POC  Influenza A&B (Binax test)  Result Value Ref Range   Influenza A, POC Negative Negative   Influenza B, POC Negative Negative  POCT rapid strep A  Result Value Ref Range   Rapid Strep A Screen Negative Negative    Labs Reviewed - No data to display  No results found.     ASSESSMENT & PLAN:  No diagnosis found.  No orders of the defined types were placed in this encounter.   Reviewed expectations re: course of current medical issues. Questions answered. Outlined signs and symptoms indicating need for more acute intervention. Patient verbalized understanding. After Visit Summary given.    Procedures:      Elvina Sidle, MD 03/16/17 [phone]

## 2017-03-16 NOTE — Discharge Instructions (Addendum)
You can take over-the-counter ibuprofen for relief of generalized symptoms.  He can take up to 4 g of Tylenol daily from all sources.  He carefully combining cough syrups and OTC Tylenol pills.  I am adding some Polytrim to your regimen.  You can fill this if you are not getting better.  You can apply this per the prescription.  Make sure you apply warm compress.

## 2017-03-16 NOTE — ED Notes (Signed)
Seen and triaged by provider before nurse

## 2017-03-18 ENCOUNTER — Encounter: Payer: Self-pay | Admitting: Radiology

## 2017-03-20 ENCOUNTER — Encounter: Payer: Self-pay | Admitting: Radiology

## 2017-03-21 ENCOUNTER — Ambulatory Visit (INDEPENDENT_AMBULATORY_CARE_PROVIDER_SITE_OTHER): Payer: 59 | Admitting: Urgent Care

## 2017-03-21 ENCOUNTER — Encounter: Payer: Self-pay | Admitting: Urgent Care

## 2017-03-21 VITALS — BP 135/84 | HR 81 | Temp 97.8°F | Resp 18 | Ht 72.75 in | Wt 260.2 lb

## 2017-03-21 DIAGNOSIS — R6889 Other general symptoms and signs: Secondary | ICD-10-CM

## 2017-03-21 DIAGNOSIS — R059 Cough, unspecified: Secondary | ICD-10-CM

## 2017-03-21 DIAGNOSIS — H5789 Other specified disorders of eye and adnexa: Secondary | ICD-10-CM

## 2017-03-21 DIAGNOSIS — R05 Cough: Secondary | ICD-10-CM | POA: Diagnosis not present

## 2017-03-21 DIAGNOSIS — R51 Headache: Secondary | ICD-10-CM | POA: Diagnosis not present

## 2017-03-21 DIAGNOSIS — J029 Acute pharyngitis, unspecified: Secondary | ICD-10-CM | POA: Diagnosis not present

## 2017-03-21 DIAGNOSIS — R52 Pain, unspecified: Secondary | ICD-10-CM | POA: Diagnosis not present

## 2017-03-21 DIAGNOSIS — R519 Headache, unspecified: Secondary | ICD-10-CM

## 2017-03-21 MED ORDER — AZELASTINE HCL 0.05 % OP SOLN: 1.0000 [drp] | Freq: Two times a day (BID) | OPHTHALMIC | 11 refills | Status: DC

## 2017-03-21 NOTE — Progress Notes (Signed)
    MRN: [id number] DOB: 11/17/1995  Subjective:   Larry Haas is a 22 y.o. male presenting for follow up on influenza. Patient has been seen here 03/14/2017 the flu and on 03/16/2017 for right eye swelling, redness, drainage. Reports that he was told he had adenovirus of his right eye. Today, reports improvement with supportive care for cough, body aches, sore throat, fever. He has improvement of his right eye as well. Has not used eye drops prescribed.   Etter SjogrenKullen has a current medication list which includes the following prescription(s): benzonatate and hydrocodone-homatropine. Also has No Known Allergies.  Etter SjogrenKullen  has a past medical history of Achilles tendon rupture, Allergy, Complication of anesthesia, Headache, and Pneumonia. Also  has a past surgical history that includes Tonsillectomy; Appendectomy; Hernia repair; and Achilles tendon surgery (Left, 06/13/2015).  Objective:   Vitals: BP 135/84   Pulse 81   Temp 97.8 F (36.6 C) (Oral)   Resp 18   Ht 6' 0.75" (1.848 m)   Wt 260 lb 3.2 oz (118 kg)   SpO2 95%   BMI 34.57 kg/m   Physical Exam  Constitutional: He is oriented to person, place, and time. He appears well-developed and well-nourished.  HENT:  Mouth/Throat: Oropharynx is clear and moist.  Eyes: Right eye exhibits no discharge. Left eye exhibits no discharge. Right conjunctiva is not injected. Right conjunctiva has no hemorrhage. Left conjunctiva is not injected. Left conjunctiva has no hemorrhage.  Neck: Normal range of motion. Neck supple.  Cardiovascular: Normal rate, regular rhythm and intact distal pulses. Exam reveals no gallop and no friction rub.  No murmur heard. Pulmonary/Chest: No respiratory distress. He has no wheezes. He has no rales.  Lymphadenopathy:    He has no cervical adenopathy.  Neurological: He is alert and oriented to person, place, and time.  Psychiatric: He has a normal mood and affect.   Assessment and Plan :   Flu-like  symptoms  Sore throat  Cough  Generalized headaches  Body aches  Redness of both eyes  Improved. Continue supportive care. Use azelastine eye drops for supportive care. Return-to-clinic precautions discussed, patient verbalized understanding.   Wallis BambergMario Baruc Tugwell, PA-C [facility] [phone] 03/21/2017 3:19 PM

## 2017-03-21 NOTE — Patient Instructions (Addendum)
Azelastine eye solution What is this medicine? AZELASTINE (a ZEL as teen) is an antihistamine. It is used in the eye to treat itching of eyes caused by hay fever or other allergies. This medicine may be used for other purposes; ask your health care provider or pharmacist if you have questions. COMMON BRAND NAME(S): Optivar What should I tell my health care provider before I take this medicine? They need to know if you have any of these conditions: -wear contact lenses -an unusual or allergic reaction to azelastine, other medicines, foods, dyes, or preservatives -pregnant or trying to become pregnant -breast-feeding How should I use this medicine? This medicine is only for use in the eye. Do not take by mouth. Follow the directions on the prescription label. Wash hands before and after use. Tilt the head back slightly and pull down the lower eyelid with your index finger to form a pouch. Try not to touch the tip of the dropper to your eye, fingertips, or any other surface. Squeeze the prescribed number of drops (usually 1 drop) into the pouch. Close the eye gently. Do not blink. Use your doses at regular intervals. Do not use your medicine more often than directed. If you use other eye medicines, they should be used at least 10 minutes before or after this medicine. Eye ointments should be applied last. Talk to your pediatrician regarding the use of this medicine in children. Special care may be needed. While this drug may be prescribed for children as young as 643 years of age for selected conditions, precautions do apply. Overdosage: If you think you have taken too much of this medicine contact a poison control center or emergency room at once. NOTE: This medicine is only for you. Do not share this medicine with others. What if I miss a dose? If you miss a dose, use it as soon as you can. If it is almost time for your next dose, use only that dose. Do not use double or extra doses. What may interact  with this medicine? Interactions are not expected. Do not use any other eye products without telling your doctor or health care professional. This list may not describe all possible interactions. Give your health care provider a list of all the medicines, herbs, non-prescription drugs, or dietary supplements you use. Also tell them if you smoke, drink alcohol, or use illegal drugs. Some items may interact with your medicine. What should I watch for while using this medicine? Tell your doctor or health care professional if your symptoms do not start to get better within 2 or 3 days. Report any serious side effects promptly. Stop using this medicine if your eyes get swollen, painful, or have a discharge, and see your doctor or health care professional as soon as you can. Contact lenses may be inserted 10 minutes after putting the medicine in the eye. Do not wear contact lenses if your eyes are red. You should not use this medicine to treat irritation that is caused by contact lenses. What side effects may I notice from receiving this medicine? Side effects that you should report to your doctor or health care professional as soon as possible: -allergic reactions like skin rash, itching or hives, swelling of the face, lips, or tongue Side effects that usually do not require medical attention (report to your doctor or health care professional if they continue or are bothersome): -bitter taste -blurred vision -eye pain, burning or stinging -fatigue -headache This list may not describe all possible  side effects. Call your doctor for medical advice about side effects. You may report side effects to FDA at 1-800-FDA-1088. Where should I keep my medicine? Keep out of the reach of children. Keep container tightly closed when not in use. Store upright between 2 and 25 degrees C (36 to 77 degrees F). Throw away any unused medicine after the expiration date. NOTE: This sheet is a summary. It may not cover all  possible information. If you have questions about this medicine, talk to your doctor, pharmacist, or health care provider.  2018 [employer] Standard (2007-03-23 14:29:55)     IF you received an x-ray today, you will receive an invoice from [facility]. Please contact [facility] at [phone] with questions or concerns regarding your invoice.   IF you received labwork today, you will receive an invoice from [place]. Please contact [facility] at [phone] with questions or concerns regarding your invoice.   Our billing staff will not be able to assist you with questions regarding bills from these companies.  You will be contacted with the lab results as soon as they are available. The fastest way to get your results is to activate your My Chart account. Instructions are located on the last page of this paperwork. If you have not heard from [place] regarding the results in 2 weeks, please contact this office.

## 2017-12-27 ENCOUNTER — Ambulatory Visit (INDEPENDENT_AMBULATORY_CARE_PROVIDER_SITE_OTHER): Payer: 59

## 2017-12-27 ENCOUNTER — Encounter: Payer: Self-pay | Admitting: Podiatry

## 2017-12-27 ENCOUNTER — Ambulatory Visit (INDEPENDENT_AMBULATORY_CARE_PROVIDER_SITE_OTHER): Payer: 59 | Admitting: Podiatry

## 2017-12-27 VITALS — BP 151/89 | HR 80 | Resp 16 | Ht 72.0 in | Wt 260.0 lb

## 2017-12-27 DIAGNOSIS — M21621 Bunionette of right foot: Secondary | ICD-10-CM | POA: Diagnosis not present

## 2017-12-27 DIAGNOSIS — M778 Other enthesopathies, not elsewhere classified: Secondary | ICD-10-CM

## 2017-12-27 DIAGNOSIS — M779 Enthesopathy, unspecified: Secondary | ICD-10-CM

## 2017-12-27 DIAGNOSIS — M25571 Pain in right ankle and joints of right foot: Secondary | ICD-10-CM | POA: Diagnosis not present

## 2017-12-27 DIAGNOSIS — M21961 Unspecified acquired deformity of right lower leg: Secondary | ICD-10-CM

## 2017-12-27 NOTE — Progress Notes (Signed)
  Subjective:  Patient ID: Larry Haas, male    DOB: 02/13/1995,  MRN: [id number]  Chief Complaint  Patient presents with  . Foot Pain    R lateral foot pain x 2 wks; 6/10 intermittent pain - no injury Tx: none    22 y.o. male presents with the above complaint. History as above confirmed with patient.   Review of Systems: Negative except as noted in the HPI. Denies N/V/F/Ch.  Past Medical History:  Diagnosis Date  . Achilles tendon rupture    left  . Allergy   . Complication of anesthesia    Stop breathing twice after having tonsils removed and started "refluxing"  . Headache   . Pneumonia    No current outpatient medications on file.  Social History   Tobacco Use  Smoking Status Never Smoker  Smokeless Tobacco Never Used    No Known Allergies Objective:   Vitals:   12/27/17 1050  BP: (!) 151/89  Pulse: 80  Resp: 16   Body mass index is 35.26 kg/m. Constitutional Well developed. Well nourished.  Vascular Dorsalis pedis pulses palpable bilaterally. Posterior tibial pulses palpable bilaterally. Capillary refill normal to all digits.  No cyanosis or clubbing noted. Pedal hair growth normal.  Neurologic Normal speech. Oriented to person, place, and time. Epicritic sensation to light touch grossly present bilaterally.  Dermatologic Nails well groomed and normal in appearance. No open wounds. No skin lesions.  Orthopedic: Normal joint ROM without pain or crepitus bilaterally. No visible deformities. No bony tenderness.   Radiographs: Taken and reviewed tailor's bunion deformity with lateral bowing 5th metatarsal Assessment:   1. Tailor's bunion of right foot   2. Capsulitis of right foot   3. Metatarsal deformity, right   4. Pain in joint of right foot    Plan:  Patient was evaluated and treated and all questions answered.  Tailor's Bunion R Foot -XR Reviewed as above. -Tailor's bunion shield dispensed -Discussed possible surgical  intervention in the future if pain persists.  Capsulitis 5th MPJ -Injection as below  Procedure: Joint Injection Location: Right 5th MPJ joint Skin Prep: Alcohol. Injectate: 0.5 cc 1% lidocaine plain, 0.5 cc dexamethasone phosphate. Disposition: Patient tolerated procedure well. Injection site dressed with a band-aid.   Return in about 6 weeks (around 02/07/2018) for Tailor's Bunion F/u.

## 2018-02-06 ENCOUNTER — Ambulatory Visit (INDEPENDENT_AMBULATORY_CARE_PROVIDER_SITE_OTHER): Payer: 59 | Admitting: Podiatry

## 2018-02-06 DIAGNOSIS — M7661 Achilles tendinitis, right leg: Secondary | ICD-10-CM

## 2018-02-06 DIAGNOSIS — M21621 Bunionette of right foot: Secondary | ICD-10-CM

## 2018-02-06 DIAGNOSIS — M779 Enthesopathy, unspecified: Secondary | ICD-10-CM

## 2018-02-06 DIAGNOSIS — M7662 Achilles tendinitis, left leg: Secondary | ICD-10-CM

## 2018-02-06 DIAGNOSIS — M778 Other enthesopathies, not elsewhere classified: Secondary | ICD-10-CM

## 2018-02-06 NOTE — Patient Instructions (Signed)

## 2018-02-06 NOTE — Progress Notes (Signed)
  Subjective:  Patient ID: Larry Haas, male    DOB: Jun 23, 1995,  MRN: [id number]  Chief Complaint  Patient presents with  . Bunions    Right Tailor's Bunion f/u. Pt states the injection worked well and currently has no pain. Pt also states the bunion pad is very effective.    23 y.o. male presents with the above complaint. History as above confirmed with patient.  Separate concern of Achilles issues bilat. History of rupture left states he has tried strengthening but has had a plateau of being able to get it strong again. Also having pain in his right Achilles does not want it to rupture.  Review of Systems: Negative except as noted in the HPI. Denies N/V/F/Ch.  Past Medical History:  Diagnosis Date  . Achilles tendon rupture    left  . Allergy   . Complication of anesthesia    Stop breathing twice after having tonsils removed and started "refluxing"  . Headache   . Pneumonia    No current outpatient medications on file.  Social History   Tobacco Use  Smoking Status Never Smoker  Smokeless Tobacco Never Used    No Known Allergies Objective:   There were no vitals filed for this visit. There is no height or weight on file to calculate BMI. Constitutional Well developed. Well nourished.  Vascular Dorsalis pedis pulses palpable bilaterally. Posterior tibial pulses palpable bilaterally. Capillary refill normal to all digits.  No cyanosis or clubbing noted. Pedal hair growth normal.  Neurologic Normal speech. Oriented to person, place, and time. Epicritic sensation to light touch grossly present bilaterally.  Dermatologic Nails well groomed and normal in appearance. No open wounds. No skin lesions.  Orthopedic: Normal joint ROM without pain or crepitus bilaterally. No visible deformities. No bony tenderness right 5th MPJ POP right Achilles tendon. 4+/5 Achilles strength left   Radiographs: None Assessment:   1. Tailor's bunion of right foot   2.  Capsulitis of right foot   3. Tendonitis, Achilles, left   4. Tendonitis, Achilles, right    Plan:  Patient was evaluated and treated and all questions answered.  Tailor's Bunion R Foot -Improved. No pain today. -Discussed repeat injection or surgical intervention in the future should pain persist.  Achilles Tendonitis Bilat, History of Rupture Left -Discussed stretching bilat -Discussed possible referral for muscle stim left. Wishes to hold off.   Return if symptoms worsen or fail to improve.

## 2018-02-10 ENCOUNTER — Encounter: Payer: Self-pay | Admitting: Podiatry

## 2018-02-10 NOTE — Progress Notes (Signed)
Requested disc of x-rays was placed up front to be mailed out to pt tomorrow, Wednesday, 11 February 2018.

## 2018-05-07 ENCOUNTER — Encounter: Payer: Self-pay | Admitting: *Deleted
